# Patient Record
Sex: Female | Born: 1950 | Race: White | Hispanic: No | Marital: Married | Smoking: Former smoker
Health system: Southern US, Community
[De-identification: ages and names within clinical notes are randomized; demographics above are authoritative.]

## PROBLEM LIST (undated history)

## (undated) DIAGNOSIS — F10239 Alcohol dependence with withdrawal, unspecified: Secondary | ICD-10-CM

## (undated) DIAGNOSIS — J449 Chronic obstructive pulmonary disease, unspecified: Secondary | ICD-10-CM

## (undated) DIAGNOSIS — Z9889 Other specified postprocedural states: Secondary | ICD-10-CM

## (undated) DIAGNOSIS — A419 Sepsis, unspecified organism: Secondary | ICD-10-CM

## (undated) DIAGNOSIS — R6521 Severe sepsis with septic shock: Secondary | ICD-10-CM

## (undated) DIAGNOSIS — K219 Gastro-esophageal reflux disease without esophagitis: Secondary | ICD-10-CM

## (undated) DIAGNOSIS — R569 Unspecified convulsions: Secondary | ICD-10-CM

## (undated) DIAGNOSIS — G9341 Metabolic encephalopathy: Secondary | ICD-10-CM

## (undated) DIAGNOSIS — F419 Anxiety disorder, unspecified: Secondary | ICD-10-CM

## (undated) DIAGNOSIS — I340 Nonrheumatic mitral (valve) insufficiency: Secondary | ICD-10-CM

## (undated) DIAGNOSIS — J9621 Acute and chronic respiratory failure with hypoxia: Secondary | ICD-10-CM

## (undated) DIAGNOSIS — J189 Pneumonia, unspecified organism: Secondary | ICD-10-CM

## (undated) DIAGNOSIS — I10 Essential (primary) hypertension: Secondary | ICD-10-CM

## (undated) HISTORY — PX: COLONOSCOPY WITH ESOPHAGOGASTRODUODENOSCOPY (EGD): SHX5779

## (undated) HISTORY — PX: EYE SURGERY: SHX253

## (undated) HISTORY — PX: TUBAL LIGATION: SHX77

## (undated) HISTORY — PX: COLONOSCOPY: SHX174

---

## 2010-04-14 DEATH — deceased

## 2017-08-02 ENCOUNTER — Other Ambulatory Visit (HOSPITAL_COMMUNITY): Payer: Medicare Other

## 2017-08-02 ENCOUNTER — Inpatient Hospital Stay
Admit: 2017-08-02 | Discharge: 2017-09-04 | Disposition: A | Discharge status: Skilled Nursing Facility | Payer: Medicare Other | Attending: Internal Medicine | Admitting: Internal Medicine

## 2017-08-02 DIAGNOSIS — J90 Pleural effusion, not elsewhere classified: Secondary | ICD-10-CM

## 2017-08-02 DIAGNOSIS — R569 Unspecified convulsions: Secondary | ICD-10-CM

## 2017-08-02 DIAGNOSIS — I340 Nonrheumatic mitral (valve) insufficiency: Secondary | ICD-10-CM

## 2017-08-02 DIAGNOSIS — J189 Pneumonia, unspecified organism: Secondary | ICD-10-CM

## 2017-08-02 DIAGNOSIS — Z431 Encounter for attention to gastrostomy: Secondary | ICD-10-CM

## 2017-08-02 DIAGNOSIS — Z0189 Encounter for other specified special examinations: Secondary | ICD-10-CM

## 2017-08-02 DIAGNOSIS — R609 Edema, unspecified: Secondary | ICD-10-CM

## 2017-08-02 DIAGNOSIS — J9690 Respiratory failure, unspecified, unspecified whether with hypoxia or hypercapnia: Secondary | ICD-10-CM

## 2017-08-02 DIAGNOSIS — F10239 Alcohol dependence with withdrawal, unspecified: Secondary | ICD-10-CM

## 2017-08-02 DIAGNOSIS — A419 Sepsis, unspecified organism: Secondary | ICD-10-CM

## 2017-08-02 DIAGNOSIS — T85598A Other mechanical complication of other gastrointestinal prosthetic devices, implants and grafts, initial encounter: Secondary | ICD-10-CM

## 2017-08-02 DIAGNOSIS — G9341 Metabolic encephalopathy: Secondary | ICD-10-CM

## 2017-08-02 DIAGNOSIS — R1310 Dysphagia, unspecified: Secondary | ICD-10-CM

## 2017-08-02 DIAGNOSIS — J9621 Acute and chronic respiratory failure with hypoxia: Secondary | ICD-10-CM

## 2017-08-02 DIAGNOSIS — R0603 Acute respiratory distress: Secondary | ICD-10-CM

## 2017-08-02 DIAGNOSIS — R6521 Severe sepsis with septic shock: Secondary | ICD-10-CM

## 2017-08-02 DIAGNOSIS — J449 Chronic obstructive pulmonary disease, unspecified: Secondary | ICD-10-CM | POA: Diagnosis present

## 2017-08-02 DIAGNOSIS — R1314 Dysphagia, pharyngoesophageal phase: Secondary | ICD-10-CM

## 2017-08-02 HISTORY — DX: Sepsis, unspecified organism: A41.9

## 2017-08-02 HISTORY — DX: Other specified postprocedural states: Z98.890

## 2017-08-02 HISTORY — DX: Unspecified convulsions: R56.9

## 2017-08-02 HISTORY — DX: Essential (primary) hypertension: I10

## 2017-08-02 HISTORY — DX: Severe sepsis with septic shock: R65.21

## 2017-08-02 HISTORY — DX: Metabolic encephalopathy: G93.41

## 2017-08-02 HISTORY — DX: Gastro-esophageal reflux disease without esophagitis: K21.9

## 2017-08-02 HISTORY — DX: Acute and chronic respiratory failure with hypoxia: J96.21

## 2017-08-02 HISTORY — DX: Chronic obstructive pulmonary disease, unspecified: J44.9

## 2017-08-02 HISTORY — DX: Anxiety disorder, unspecified: F41.9

## 2017-08-02 HISTORY — DX: Alcohol dependence with withdrawal, unspecified: F10.239

## 2017-08-02 HISTORY — DX: Nonrheumatic mitral (valve) insufficiency: I34.0

## 2017-08-02 HISTORY — DX: Pneumonia, unspecified organism: J18.9

## 2017-08-02 LAB — CBC WITH DIFFERENTIAL/PLATELET
ABS IMMATURE GRANULOCYTES: 0.2 10*3/uL — AB (ref 0.0–0.1)
BASOS ABS: 0.1 10*3/uL (ref 0.0–0.1)
Basophils Relative: 0 %
Eosinophils Absolute: 0.4 10*3/uL (ref 0.0–0.7)
Eosinophils Relative: 2 %
HEMATOCRIT: 30.1 % — AB (ref 36.0–46.0)
HEMOGLOBIN: 8.8 g/dL — AB (ref 12.0–15.0)
Immature Granulocytes: 1 %
LYMPHS ABS: 3 10*3/uL (ref 0.7–4.0)
LYMPHS PCT: 15 %
MCH: 28.4 pg (ref 26.0–34.0)
MCHC: 29.2 g/dL — ABNORMAL LOW (ref 30.0–36.0)
MCV: 97.1 fL (ref 78.0–100.0)
MONO ABS: 1.6 10*3/uL — AB (ref 0.1–1.0)
Monocytes Relative: 8 %
NEUTROS ABS: 15.4 10*3/uL — AB (ref 1.7–7.7)
Neutrophils Relative %: 74 %
Platelets: 360 10*3/uL (ref 150–400)
RBC: 3.1 MIL/uL — ABNORMAL LOW (ref 3.87–5.11)
RDW: 17.2 % — ABNORMAL HIGH (ref 11.5–15.5)
WBC: 20.6 10*3/uL — ABNORMAL HIGH (ref 4.0–10.5)

## 2017-08-02 LAB — BLOOD GAS, ARTERIAL
ACID-BASE EXCESS: 9.4 mmol/L — AB (ref 0.0–2.0)
BICARBONATE: 33.1 mmol/L — AB (ref 20.0–28.0)
FIO2: 40
MECHVT: 370 mL
O2 SAT: 96.2 %
PATIENT TEMPERATURE: 98.6
PCO2 ART: 42.2 mmHg (ref 32.0–48.0)
PEEP: 5 cmH2O
PO2 ART: 76.1 mmHg — AB (ref 83.0–108.0)
RATE: 12 resp/min
pH, Arterial: 7.507 — ABNORMAL HIGH (ref 7.350–7.450)

## 2017-08-02 LAB — COMPREHENSIVE METABOLIC PANEL
ALBUMIN: 1.9 g/dL — AB (ref 3.5–5.0)
ALT: 23 U/L (ref 14–54)
ANION GAP: 9 (ref 5–15)
AST: 34 U/L (ref 15–41)
Alkaline Phosphatase: 138 U/L — ABNORMAL HIGH (ref 38–126)
BILIRUBIN TOTAL: 0.5 mg/dL (ref 0.3–1.2)
BUN: 15 mg/dL (ref 6–20)
CHLORIDE: 97 mmol/L — AB (ref 101–111)
CO2: 31 mmol/L (ref 22–32)
Calcium: 8.5 mg/dL — ABNORMAL LOW (ref 8.9–10.3)
Creatinine, Ser: 0.38 mg/dL — ABNORMAL LOW (ref 0.44–1.00)
GFR calc Af Amer: >60 mL/min (ref >60)
GFR calc non Af Amer: >60 mL/min (ref >60)
GLUCOSE: 69 mg/dL (ref 65–99)
POTASSIUM: 3.8 mmol/L (ref 3.5–5.1)
SODIUM: 137 mmol/L (ref 135–145)
Total Protein: 6.4 g/dL — ABNORMAL LOW (ref 6.5–8.1)

## 2017-08-02 LAB — PROTIME-INR
INR: 1.15
PROTHROMBIN TIME: 14.7 s (ref 11.4–15.2)

## 2017-08-03 ENCOUNTER — Other Ambulatory Visit (HOSPITAL_COMMUNITY): Payer: Medicare Other

## 2017-08-03 LAB — C DIFFICILE QUICK SCREEN W PCR REFLEX
C DIFFICILE (CDIFF) INTERP: NOT DETECTED
C DIFFICILE (CDIFF) TOXIN: NEGATIVE
C DIFFICLE (CDIFF) ANTIGEN: NEGATIVE

## 2017-08-03 LAB — MAGNESIUM: Magnesium: 1.8 mg/dL (ref 1.7–2.4)

## 2017-08-03 MED ORDER — TROPICAL LIQUID NUTRITION PO LIQD
5.00 | ORAL | Status: DC
Start: ? — End: 2017-08-03

## 2017-08-03 MED ORDER — THIAMINE HCL 100 MG PO TABS
100.00 | ORAL_TABLET | ORAL | Status: DC
Start: ? — End: 2017-08-03

## 2017-08-03 MED ORDER — GENERIC EXTERNAL MEDICATION
200.00 | Status: DC
Start: 2017-08-02 — End: 2017-08-03

## 2017-08-03 MED ORDER — OXYCODONE HCL 5 MG/5ML PO SOLN
5.00 | ORAL | Status: DC
Start: ? — End: 2017-08-03

## 2017-08-03 MED ORDER — SODIUM CHLORIDE 0.9 % IV SOLN
10.00 | INTRAVENOUS | Status: DC
Start: ? — End: 2017-08-03

## 2017-08-03 MED ORDER — ALTEPLASE 2 MG IJ SOLR
2.00 | INTRAMUSCULAR | Status: DC
Start: ? — End: 2017-08-03

## 2017-08-03 MED ORDER — CLOTRIMAZOLE-BETAMETHASONE 1-0.05 % EX CREA
TOPICAL_CREAM | CUTANEOUS | Status: DC
Start: 2017-08-02 — End: 2017-08-03

## 2017-08-03 MED ORDER — POTASSIUM CHLORIDE CRYS ER 20 MEQ PO TBCR
20.00 | EXTENDED_RELEASE_TABLET | ORAL | Status: DC
Start: ? — End: 2017-08-03

## 2017-08-03 MED ORDER — GENERIC EXTERNAL MEDICATION
Status: DC
Start: ? — End: 2017-08-03

## 2017-08-03 MED ORDER — MAGNESIUM OXIDE 400 MG PO TABS
800.00 | ORAL_TABLET | ORAL | Status: DC
Start: 2017-08-02 — End: 2017-08-03

## 2017-08-03 MED ORDER — FOLIC ACID 1 MG PO TABS
1.00 | ORAL_TABLET | ORAL | Status: DC
Start: 2017-08-03 — End: 2017-08-03

## 2017-08-03 MED ORDER — POTASSIUM CHLORIDE 20 MEQ/50ML IV SOLN
20.00 | INTRAVENOUS | Status: DC
Start: ? — End: 2017-08-03

## 2017-08-03 MED ORDER — POTASSIUM CHLORIDE 20 MEQ/15ML (10%) PO SOLN
20.00 | ORAL | Status: DC
Start: ? — End: 2017-08-03

## 2017-08-03 MED ORDER — BUMETANIDE 0.25 MG/ML IJ SOLN
1.00 | INTRAMUSCULAR | Status: DC
Start: 2017-08-02 — End: 2017-08-03

## 2017-08-03 MED ORDER — INSULIN LISPRO 100 UNIT/ML ~~LOC~~ SOLN
1.00 | SUBCUTANEOUS | Status: DC
Start: ? — End: 2017-08-03

## 2017-08-03 MED ORDER — FAMOTIDINE 20 MG/2ML IV SOLN
20.00 | INTRAVENOUS | Status: DC
Start: 2017-08-02 — End: 2017-08-03

## 2017-08-03 MED ORDER — INSULIN GLARGINE 100 UNIT/ML ~~LOC~~ SOLN
1.00 | SUBCUTANEOUS | Status: DC
Start: 2017-08-02 — End: 2017-08-03

## 2017-08-04 LAB — BLOOD CULTURE ID PANEL (REFLEXED)
Acinetobacter baumannii: NOT DETECTED
CANDIDA ALBICANS: NOT DETECTED
CANDIDA GLABRATA: NOT DETECTED
Candida krusei: NOT DETECTED
Candida parapsilosis: NOT DETECTED
Candida tropicalis: NOT DETECTED
ENTEROBACTERIACEAE SPECIES: NOT DETECTED
ENTEROCOCCUS SPECIES: NOT DETECTED
ESCHERICHIA COLI: NOT DETECTED
Enterobacter cloacae complex: NOT DETECTED
Haemophilus influenzae: NOT DETECTED
Klebsiella oxytoca: NOT DETECTED
Klebsiella pneumoniae: NOT DETECTED
LISTERIA MONOCYTOGENES: NOT DETECTED
Methicillin resistance: DETECTED — AB
NEISSERIA MENINGITIDIS: NOT DETECTED
Proteus species: NOT DETECTED
Pseudomonas aeruginosa: NOT DETECTED
STREPTOCOCCUS AGALACTIAE: NOT DETECTED
STREPTOCOCCUS PNEUMONIAE: NOT DETECTED
STREPTOCOCCUS PYOGENES: NOT DETECTED
STREPTOCOCCUS SPECIES: NOT DETECTED
Serratia marcescens: NOT DETECTED
Staphylococcus aureus (BCID): NOT DETECTED
Staphylococcus species: DETECTED — AB

## 2017-08-04 LAB — URINALYSIS, ROUTINE W REFLEX MICROSCOPIC
BILIRUBIN URINE: NEGATIVE
Bacteria, UA: NONE SEEN
GLUCOSE, UA: NEGATIVE mg/dL
KETONES UR: NEGATIVE mg/dL
Leukocytes, UA: NEGATIVE
Nitrite: NEGATIVE
Protein, ur: NEGATIVE mg/dL
Specific Gravity, Urine: 1.019 (ref 1.005–1.030)
pH: 6 (ref 5.0–8.0)

## 2017-08-04 LAB — BASIC METABOLIC PANEL
ANION GAP: 8 (ref 5–15)
BUN: 15 mg/dL (ref 6–20)
CO2: 32 mmol/L (ref 22–32)
CREATININE: 0.42 mg/dL — AB (ref 0.44–1.00)
Calcium: 8 mg/dL — ABNORMAL LOW (ref 8.9–10.3)
Chloride: 95 mmol/L — ABNORMAL LOW (ref 101–111)
GFR calc Af Amer: >60 mL/min (ref >60)
GFR calc non Af Amer: >60 mL/min (ref >60)
GLUCOSE: 105 mg/dL — AB (ref 65–99)
Potassium: 3.1 mmol/L — ABNORMAL LOW (ref 3.5–5.1)
Sodium: 135 mmol/L (ref 135–145)

## 2017-08-04 LAB — MAGNESIUM: Magnesium: 1.8 mg/dL (ref 1.7–2.4)

## 2017-08-04 NOTE — Progress Notes (Signed)
PHARMACY - PHYSICIAN COMMUNICATION CRITICAL VALUE ALERT - BLOOD CULTURE IDENTIFICATION (BCID)  Tiffany Gordon is an 67 y.o. female who [facility]   Assessment:  1/4 blood cultures with MRSE. Likely contaminant  Name of physician (or Provider) Contacted: Hijazi   Current antibiotics: None currently  Changes to prescribed antibiotics recommended:  Would not recommend adding any antibiotics based on this blood culture   Results for orders placed or performed during the hospital encounter of 08/02/17  Blood Culture ID Panel (Reflexed) (Collected: 08/03/2017 12:28 PM)  Result Value Ref Range   Enterococcus species NOT DETECTED NOT DETECTED   Listeria monocytogenes NOT DETECTED NOT DETECTED   Staphylococcus species DETECTED (A) NOT DETECTED   Staphylococcus aureus NOT DETECTED NOT DETECTED   Methicillin resistance DETECTED (A) NOT DETECTED   Streptococcus species NOT DETECTED NOT DETECTED   Streptococcus agalactiae NOT DETECTED NOT DETECTED   Streptococcus pneumoniae NOT DETECTED NOT DETECTED   Streptococcus pyogenes NOT DETECTED NOT DETECTED   Acinetobacter baumannii NOT DETECTED NOT DETECTED   Enterobacteriaceae species NOT DETECTED NOT DETECTED   Enterobacter cloacae complex NOT DETECTED NOT DETECTED   Escherichia coli NOT DETECTED NOT DETECTED   Klebsiella oxytoca NOT DETECTED NOT DETECTED   Klebsiella pneumoniae NOT DETECTED NOT DETECTED   Proteus species NOT DETECTED NOT DETECTED   Serratia marcescens NOT DETECTED NOT DETECTED   Haemophilus influenzae NOT DETECTED NOT DETECTED   Neisseria meningitidis NOT DETECTED NOT DETECTED   Pseudomonas aeruginosa NOT DETECTED NOT DETECTED   Candida albicans NOT DETECTED NOT DETECTED   Candida glabrata NOT DETECTED NOT DETECTED   Candida krusei NOT DETECTED NOT DETECTED   Candida parapsilosis NOT DETECTED NOT DETECTED   Candida tropicalis NOT DETECTED NOT DETECTED   Sharin MonsEmily Asna Muldrow, PharmD,  BCPS PGY2 Infectious Diseases Pharmacy Resident Phone: [phone] 08/04/2017  3:52 PM

## 2017-08-05 ENCOUNTER — Institutional Professional Consult (permissible substitution) (HOSPITAL_COMMUNITY): Payer: Medicare Other

## 2017-08-05 LAB — BASIC METABOLIC PANEL
ANION GAP: 10 (ref 5–15)
BUN: 12 mg/dL (ref 6–20)
CO2: 35 mmol/L — ABNORMAL HIGH (ref 22–32)
Calcium: 8.1 mg/dL — ABNORMAL LOW (ref 8.9–10.3)
Chloride: 91 mmol/L — ABNORMAL LOW (ref 101–111)
Creatinine, Ser: 0.43 mg/dL — ABNORMAL LOW (ref 0.44–1.00)
GFR calc Af Amer: >60 mL/min (ref >60)
GLUCOSE: 121 mg/dL — AB (ref 65–99)
Potassium: 3 mmol/L — ABNORMAL LOW (ref 3.5–5.1)
SODIUM: 136 mmol/L (ref 135–145)

## 2017-08-05 LAB — CBC
HCT: 26 % — ABNORMAL LOW (ref 36.0–46.0)
Hemoglobin: 7.8 g/dL — ABNORMAL LOW (ref 12.0–15.0)
MCH: 28.4 pg (ref 26.0–34.0)
MCHC: 30 g/dL (ref 30.0–36.0)
MCV: 94.5 fL (ref 78.0–100.0)
PLATELETS: 375 10*3/uL (ref 150–400)
RBC: 2.75 MIL/uL — ABNORMAL LOW (ref 3.87–5.11)
RDW: 16.7 % — AB (ref 11.5–15.5)
WBC: 14.7 10*3/uL — ABNORMAL HIGH (ref 4.0–10.5)

## 2017-08-05 LAB — BRAIN NATRIURETIC PEPTIDE: B NATRIURETIC PEPTIDE 5: 940.5 pg/mL — AB (ref 0.0–100.0)

## 2017-08-05 MED ORDER — GENERIC EXTERNAL MEDICATION
Status: DC
Start: ? — End: 2017-08-05

## 2017-08-06 DIAGNOSIS — J449 Chronic obstructive pulmonary disease, unspecified: Secondary | ICD-10-CM | POA: Diagnosis not present

## 2017-08-06 DIAGNOSIS — R569 Unspecified convulsions: Secondary | ICD-10-CM

## 2017-08-06 DIAGNOSIS — R6521 Severe sepsis with septic shock: Secondary | ICD-10-CM

## 2017-08-06 DIAGNOSIS — J90 Pleural effusion, not elsewhere classified: Secondary | ICD-10-CM

## 2017-08-06 DIAGNOSIS — G9341 Metabolic encephalopathy: Secondary | ICD-10-CM | POA: Diagnosis not present

## 2017-08-06 DIAGNOSIS — I340 Nonrheumatic mitral (valve) insufficiency: Secondary | ICD-10-CM | POA: Diagnosis not present

## 2017-08-06 DIAGNOSIS — A419 Sepsis, unspecified organism: Secondary | ICD-10-CM

## 2017-08-06 DIAGNOSIS — F10239 Alcohol dependence with withdrawal, unspecified: Secondary | ICD-10-CM

## 2017-08-06 DIAGNOSIS — J189 Pneumonia, unspecified organism: Secondary | ICD-10-CM

## 2017-08-06 DIAGNOSIS — J9621 Acute and chronic respiratory failure with hypoxia: Secondary | ICD-10-CM | POA: Diagnosis not present

## 2017-08-06 LAB — URINE CULTURE: CULTURE: NO GROWTH

## 2017-08-06 LAB — VANCOMYCIN, TROUGH: Vancomycin Tr: 6 ug/mL — ABNORMAL LOW (ref 15–20)

## 2017-08-06 LAB — POTASSIUM: POTASSIUM: 4.1 mmol/L (ref 3.5–5.1)

## 2017-08-07 ENCOUNTER — Encounter: Payer: Self-pay | Admitting: Internal Medicine

## 2017-08-07 DIAGNOSIS — F10239 Alcohol dependence with withdrawal, unspecified: Secondary | ICD-10-CM | POA: Diagnosis not present

## 2017-08-07 DIAGNOSIS — J9621 Acute and chronic respiratory failure with hypoxia: Secondary | ICD-10-CM

## 2017-08-07 DIAGNOSIS — R6521 Severe sepsis with septic shock: Secondary | ICD-10-CM

## 2017-08-07 DIAGNOSIS — J189 Pneumonia, unspecified organism: Secondary | ICD-10-CM

## 2017-08-07 DIAGNOSIS — I340 Nonrheumatic mitral (valve) insufficiency: Secondary | ICD-10-CM

## 2017-08-07 DIAGNOSIS — R569 Unspecified convulsions: Secondary | ICD-10-CM

## 2017-08-07 DIAGNOSIS — A419 Sepsis, unspecified organism: Secondary | ICD-10-CM

## 2017-08-07 DIAGNOSIS — G9341 Metabolic encephalopathy: Secondary | ICD-10-CM | POA: Diagnosis not present

## 2017-08-07 DIAGNOSIS — F10939 Alcohol use, unspecified with withdrawal, unspecified: Secondary | ICD-10-CM

## 2017-08-07 DIAGNOSIS — J449 Chronic obstructive pulmonary disease, unspecified: Secondary | ICD-10-CM | POA: Diagnosis present

## 2017-08-07 HISTORY — DX: Alcohol dependence with withdrawal, unspecified: F10.239

## 2017-08-07 HISTORY — DX: Acute and chronic respiratory failure with hypoxia: J96.21

## 2017-08-07 HISTORY — DX: Sepsis, unspecified organism: A41.9

## 2017-08-07 HISTORY — DX: Unspecified convulsions: R56.9

## 2017-08-07 HISTORY — DX: Pneumonia, unspecified organism: J18.9

## 2017-08-07 HISTORY — DX: Metabolic encephalopathy: G93.41

## 2017-08-07 HISTORY — DX: Nonrheumatic mitral (valve) insufficiency: I34.0

## 2017-08-07 HISTORY — DX: Alcohol use, unspecified with withdrawal, unspecified: F10.939

## 2017-08-07 LAB — CULTURE, BLOOD (ROUTINE X 2)

## 2017-08-07 NOTE — Progress Notes (Signed)
[facility]   PULMONARY SERVICE  PROGRESS NOTE  Date of Service: 08/07/2017  Tiffany Gordon Brott  ZOX:[id number]  DOB: 08/15/1950   DOA: 08/02/2017  Referring Physician: Carron CurieAli Hijazi, MD  HPI: Tiffany Gordon Low is a 67 y.o. female seen for follow up of Acute on Chronic Respiratory Failure.  She weaned for about 4 hours on pressure support mode tolerated it fairly well right now is back on resting mode and assist control  Medications: Reviewed on Rounds  Physical Exam:  Vitals: Temperature 98.4 pulse 88 respiratory rate 26 blood pressure is 118/68 saturations 100%  Ventilator Settings mode of ventilation assist control FiO2 40% tidal volume is 421 PEEP 5  . General: Comfortable at this time . Eyes: Grossly normal lids, irises & conjunctiva . ENT: grossly tongue is normal . Neck: no obvious mass . Cardiovascular: S1 S2 normal no gallop . Respiratory: No rhonchi or rales are noted . Abdomen: soft . Skin: no rash seen on limited exam . Musculoskeletal: not rigid . Psychiatric:unable to assess . Neurologic: no seizure no involuntary movements         Lab Data:   Basic Metabolic Panel: Recent Labs  Lab 08/02/17 1900 08/03/17 0901 08/04/17 0753 08/05/17 1004 08/06/17 1409  NA 137  --  135 136  --   K 3.8  --  3.1* 3.0* 4.1  CL 97*  --  95* 91*  --   CO2 31  --  32 35*  --   GLUCOSE 69  --  105* 121*  --   BUN 15  --  15 12  --   CREATININE 0.38*  --  0.42* 0.43*  --   CALCIUM 8.5*  --  8.0* 8.1*  --   MG  --  1.8 1.8  --   --     Liver Function Tests: Recent Labs  Lab 08/02/17 1900  AST 34  ALT 23  ALKPHOS 138*  BILITOT 0.5  PROT 6.4*  ALBUMIN 1.9*   No results for input(s): LIPASE, AMYLASE in the last 168 hours. No results for input(s): AMMONIA in the last 168 hours.  CBC: Recent Labs  Lab 08/02/17 1900 08/05/17 1004  WBC 20.6* 14.7*  NEUTROABS 15.4*  --   HGB 8.8* 7.8*  HCT 30.1* 26.0*  MCV  97.1 94.5  PLT 360 375    Cardiac Enzymes: No results for input(s): CKTOTAL, CKMB, CKMBINDEX, TROPONINI in the last 168 hours.  BNP (last 3 results) Recent Labs    08/05/17 1004  BNP 940.5*    ProBNP (last 3 results) No results for input(s): PROBNP in the last 8760 hours.  Radiological Exams: No results found.  Assessment/Plan Active Problems:   Acute on chronic respiratory failure with hypoxia (HCC)   Severe sepsis with septic shock (HCC)   Healthcare-associated pneumonia   Acute metabolic encephalopathy   Alcohol withdrawal seizure with complication, with unspecified complication (HCC)   Severe mitral valve regurgitation   COPD (chronic obstructive pulmonary disease) (HCC)   1. Acute on chronic respiratory failure with hypoxia we will continue with full support on assist control right now is on 40% FiO2 continue pulmonary toilet secretion management patient's prognosis is guarded as far as weaning is concerned. 2. Severe sepsis with shock treated with antibiotics we will continue to monitor. 3. Healthcare associated pneumonia treated with antibiotics currently is clinically improving 4. Alcohol withdrawal no active seizures noted 5. Severe mitral regurg at baseline 6. COPD severe  disease we will continue to monitor   I have personally seen and evaluated the patient, evaluated laboratory and imaging results, formulated the assessment and plan and placed orders. The Patient requires high complexity decision making for assessment and support.  Case was discussed on Rounds with the Respiratory Therapy Staff  Yevonne Pax, MD [facility] Pulmonary Critical Care Medicine Sleep Medicine

## 2017-08-07 NOTE — Consult Note (Signed)
[facility] [place]  [facility]UL[facility]  Date of Service: 08/06/2017 PULMONARY CONSULT   Tiffany NailerDeborah Parks Belflower  ZOX:[id number]  DOB: 01/14/1951   DOA: 08/02/2017  Referring Physician: Carron CurieAli Hijazi, MD  HPI: Tiffany NailerDeborah Parks Gordon is a 67 y.o. female seen for follow up of Acute on Chronic Respiratory Failure.  Patient presented to the hospital with multiple medical problems including a history of COPD, will abuse and tobacco abuse came to the hospital with a withdrawal seizure.  At the time of presentation chest x-ray was done which showed pneumonia along with an effusion.  Respiratory status apparently continued to worsen ended up using BiPAP initially had been placed on 100% FiO2.  However at this was not enough to hold the patient's status and so therefore the patient was intubated.  Patient was diuresed because also was felt to have CHF.  Patient also was hypotensive at the time and had an elevated lactate.  Patient underwent an MRI to evaluate the seizure but there was no active neurological disease noted.  It was felt mostly be due to the alcohol history.  In addition patient developed signs and symptoms of work Warnicke's encephalopathy and so therefore was given thiamine.  The patient also was started on antibiotics for presumed sepsis patient was treated with Zosyn.  She eventually ended up having to have a tracheostomy done because of failure to come off of the ventilator.  At this time the patient is on the ventilator and has been on pressure assist control mode currently requiring about 40% oxygen.  Review of Systems:  ROS performed and is unremarkable other than noted above.  Past Medical History:  Diagnosis Date  . Acute metabolic encephalopathy 08/07/2017  . Acute on chronic respiratory failure with hypoxia (HCC) 08/07/2017  . Alcohol withdrawal seizure with complication, with unspecified complication (HCC) 08/07/2017  . Anxiety disorder   . COPD  (chronic obstructive pulmonary disease) (HCC)   . GERD without esophagitis   . Healthcare-associated pneumonia 08/07/2017  . History of esophagogastroduodenoscopy (EGD)   . Hypertension   . Severe mitral valve regurgitation 08/07/2017  . Severe sepsis with septic shock (HCC) 08/07/2017    Past Surgical History:  Procedure Laterality Date  . COLONOSCOPY    . COLONOSCOPY WITH ESOPHAGOGASTRODUODENOSCOPY (EGD)    . EYE SURGERY    . TUBAL LIGATION      Social History:    reports that she has quit smoking. She has never used smokeless tobacco. She reports that she drank alcohol. She reports that she has current or past drug history.  Family History: Non-Contributory to the present illness  Allergies not on file  Medications: Reviewed on Rounds  Physical Exam:  Vitals: Temperature 97.2 pulse 89 respiratory rate 37 blood pressure 107/52 saturations 98%  Ventilator Settings mode of ventilation pressure assist control FiO2 40% PEEP 5 tidal volume 450  . General: Comfortable at this time . Eyes: Grossly normal lids, irises & conjunctiva . ENT: grossly tongue is normal . Neck: no obvious mass . Cardiovascular: S1-S2 normal no gallop or rub . Respiratory: No rhonchi were noted at this time . Abdomen: Soft and nontender . Skin: no rash seen on limited exam . Musculoskeletal: not rigid . Psychiatric:unable to assess . Neurologic: no seizure no involuntary movements         Labs on Admission:  Basic Metabolic Panel: Recent Labs  Lab 08/02/17 1900 08/03/17 0901 08/04/17 0753 08/05/17 1004 08/06/17 1409  NA 137  --  135 136  --   K 3.8  --  3.1* 3.0* 4.1  CL 97*  --  95* 91*  --   CO2 31  --  32 35*  --   GLUCOSE 69  --  105* 121*  --   BUN 15  --  15 12  --   CREATININE 0.38*  --  0.42* 0.43*  --   CALCIUM 8.5*  --  8.0* 8.1*  --   MG  --  1.8 1.8  --   --     Liver Function Tests: Recent Labs  Lab 08/02/17 1900  AST 34  ALT 23  ALKPHOS 138*  BILITOT 0.5  PROT  6.4*  ALBUMIN 1.9*   No results for input(s): LIPASE, AMYLASE in the last 168 hours. No results for input(s): AMMONIA in the last 168 hours.  CBC: Recent Labs  Lab 08/02/17 1900 08/05/17 1004  WBC 20.6* 14.7*  NEUTROABS 15.4*  --   HGB 8.8* 7.8*  HCT 30.1* 26.0*  MCV 97.1 94.5  PLT 360 375    Cardiac Enzymes: No results for input(s): CKTOTAL, CKMB, CKMBINDEX, TROPONINI in the last 168 hours.  BNP (last 3 results) Recent Labs    08/05/17 1004  BNP 940.5*    ProBNP (last 3 results) No results for input(s): PROBNP in the last 8760 hours.   Radiological Exams on Admission: Dg Chest Port 1 View  Result Date: 08/05/2017 CLINICAL DATA:  Followup for pleural effusions and edema. EXAM: PORTABLE CHEST 1 VIEW COMPARISON:  08/03/2017 FINDINGS: Bilateral pleural effusions obscure the hemidiaphragms. There is hazy airspace opacity with interstitial prominence in the lungs, greater on the right. The appearance is unchanged from the prior study allowing for differences in patient positioning and technique. No pneumothorax. Tracheostomy tube, nasogastric tube and left internal jugular central venous line are stable. IMPRESSION: 1. No significant change when compared the prior study. 2. Bilateral pleural effusions. Interstitial and hazy airspace lung opacities, most evident on the right. Findings are consistent with asymmetric edema. Electronically Signed   By: Amie Portland M.D.   On: 08/05/2017 15:52    Assessment/Plan Active Problems:   Acute on chronic respiratory failure with hypoxia (HCC)   Severe sepsis with septic shock (HCC)   Healthcare-associated pneumonia   Acute metabolic encephalopathy   Alcohol withdrawal seizure with complication, with unspecified complication (HCC)   Severe mitral valve regurgitation   COPD (chronic obstructive pulmonary disease) (HCC)   1. Acute on chronic respiratory failure with hypoxia at this time patient will be tried on pressure support  ventilation as discussed above.  We will continue with supportive care titrate oxygen titrate pulmonary volumes as tolerated. 2. Severe sepsis with shock currently patient is hemodynamically stable we will continue with present management. 3. Healthcare associated pneumonia was treated at the outside facility we will continue to monitor with chest x-rays.  Last film showed no significant changed with bilateral pleural effusion and some hazy airspace diseases. 4. Metabolic encephalopathy she seems to be at baseline. 5. COPD severe disease we will continue to follow 6. Severe mitral regurgitation we will continue to follow need to follow the fluid status very closely as she could easily become fluid overloaded from decompensation.  I have personally seen and evaluated the patient, evaluated laboratory and imaging results, formulated the assessment and plan and placed orders. The Patient requires high complexity decision making for assessment and support.  Case was discussed on Rounds with the Respiratory Therapy Staff Time Spent  70minutes  Yevonne PaxSaadat A Jerah Esty, MD [facility] Pulmonary Critical Care Medicine Sleep Medicine

## 2017-08-08 DIAGNOSIS — J449 Chronic obstructive pulmonary disease, unspecified: Secondary | ICD-10-CM | POA: Diagnosis not present

## 2017-08-08 DIAGNOSIS — G9341 Metabolic encephalopathy: Secondary | ICD-10-CM | POA: Diagnosis not present

## 2017-08-08 DIAGNOSIS — J9621 Acute and chronic respiratory failure with hypoxia: Secondary | ICD-10-CM | POA: Diagnosis not present

## 2017-08-08 DIAGNOSIS — F10239 Alcohol dependence with withdrawal, unspecified: Secondary | ICD-10-CM | POA: Diagnosis not present

## 2017-08-08 LAB — VANCOMYCIN, TROUGH
Vancomycin Tr: 23 ug/mL (ref 15–20)
Vancomycin Tr: 19 ug/mL (ref 15–20)

## 2017-08-08 LAB — CBC
HCT: 26.6 % — ABNORMAL LOW (ref 36.0–46.0)
HEMOGLOBIN: 7.7 g/dL — AB (ref 12.0–15.0)
MCH: 28.4 pg (ref 26.0–34.0)
MCHC: 28.9 g/dL — ABNORMAL LOW (ref 30.0–36.0)
MCV: 98.2 fL (ref 78.0–100.0)
PLATELETS: 434 10*3/uL — AB (ref 150–400)
RBC: 2.71 MIL/uL — AB (ref 3.87–5.11)
RDW: 17.2 % — ABNORMAL HIGH (ref 11.5–15.5)
WBC: 18.6 10*3/uL — AB (ref 4.0–10.5)

## 2017-08-08 LAB — CULTURE, RESPIRATORY W GRAM STAIN

## 2017-08-08 LAB — CULTURE, RESPIRATORY

## 2017-08-08 NOTE — Progress Notes (Signed)
Pulmonary Critical Care Medicine [facility] [place]   PULMONARY SERVICE  PROGRESS NOTE  Date of Service: 08/08/2017  Tiffany Gordon  ZOX:[id number]  DOB: 09-16-1950   DOA: 08/02/2017  Referring Physician: Carron Curie, MD  HPI: Tiffany Gordon is a 67 y.o. female seen for follow up of Acute on Chronic Respiratory Failure.  Patient was on pressure support mode has been tolerating the wean fairly well.  The goal for pressure support is now 8 hours.  Currently is on 28% FiO2  Medications: Reviewed on Rounds  Physical Exam:  Vitals: Temperature 98.2 pulse 82 respiratory rate 15 blood pressure 134/75 saturations 100%  Ventilator Settings mode of ventilation pressure support FiO2 28% tidal volume 502 pressure support 12 PEEP 5  . General: Comfortable at this time . Eyes: Grossly normal lids, irises & conjunctiva . ENT: grossly tongue is normal . Neck: no obvious mass . Cardiovascular: S1 S2 normal no gallop . Respiratory: No rhonchi expansion is equal . Abdomen: soft . Skin: no rash seen on limited exam . Musculoskeletal: not rigid . Psychiatric:unable to assess . Neurologic: no seizure no involuntary movements         Lab Data:   Basic Metabolic Panel: Recent Labs  Lab 08/02/17 1900 08/03/17 0901 08/04/17 0753 08/05/17 1004 08/06/17 1409  NA 137  --  135 136  --   K 3.8  --  3.1* 3.0* 4.1  CL 97*  --  95* 91*  --   CO2 31  --  32 35*  --   GLUCOSE 69  --  105* 121*  --   BUN 15  --  15 12  --   CREATININE 0.38*  --  0.42* 0.43*  --   CALCIUM 8.5*  --  8.0* 8.1*  --   MG  --  1.8 1.8  --   --     Liver Function Tests: Recent Labs  Lab 08/02/17 1900  AST 34  ALT 23  ALKPHOS 138*  BILITOT 0.5  PROT 6.4*  ALBUMIN 1.9*   No results for input(s): LIPASE, AMYLASE in the last 168 hours. No results for input(s): AMMONIA in the last 168 hours.  CBC: Recent Labs  Lab 08/02/17 1900 08/05/17 1004 08/08/17 0656  WBC 20.6* 14.7* 18.6*   NEUTROABS 15.4*  --   --   HGB 8.8* 7.8* 7.7*  HCT 30.1* 26.0* 26.6*  MCV 97.1 94.5 98.2  PLT 360 375 434*    Cardiac Enzymes: No results for input(s): CKTOTAL, CKMB, CKMBINDEX, TROPONINI in the last 168 hours.  BNP (last 3 results) Recent Labs    08/05/17 1004  BNP 940.5*    ProBNP (last 3 results) No results for input(s): PROBNP in the last 8760 hours.  Radiological Exams: No results found.  Assessment/Plan Active Problems:   Acute on chronic respiratory failure with hypoxia (HCC)   Severe sepsis with septic shock (HCC)   Healthcare-associated pneumonia   Acute metabolic encephalopathy   Alcohol withdrawal seizure with complication, with unspecified complication (HCC)   Severe mitral valve regurgitation   COPD (chronic obstructive pulmonary disease) (HCC)   1. Acute on chronic respiratory failure with hypoxia patient can to continue weaning as mentioned the goal is for 8 hours on pressure support today 2. Severe sepsis with hypotension clinically improved patient is hemodynamically stable. 3. Healthcare associated pneumonia treated with antibiotics we will continue to follow 4. Acute metabolic encephalopathy grossly unchanged we will monitor 5. Alcoholic withdrawal seizures no active  seizures noted at this point 6. Severe mitral regurg at baseline 7. COPD severe disease we will monitor   I have personally seen and evaluated the patient, evaluated laboratory and imaging results, formulated the assessment and plan and placed orders. The Patient requires high complexity decision making for assessment and support.  Case was discussed on Rounds with the Respiratory Therapy Staff  Yevonne PaxSaadat A Bentzion Dauria, MD [facility] Pulmonary Critical Care Medicine Sleep Medicine

## 2017-08-10 ENCOUNTER — Other Ambulatory Visit (HOSPITAL_COMMUNITY): Payer: Medicare Other

## 2017-08-10 DIAGNOSIS — J9621 Acute and chronic respiratory failure with hypoxia: Secondary | ICD-10-CM | POA: Diagnosis not present

## 2017-08-10 DIAGNOSIS — G9341 Metabolic encephalopathy: Secondary | ICD-10-CM | POA: Diagnosis not present

## 2017-08-10 DIAGNOSIS — F10239 Alcohol dependence with withdrawal, unspecified: Secondary | ICD-10-CM | POA: Diagnosis not present

## 2017-08-10 DIAGNOSIS — J449 Chronic obstructive pulmonary disease, unspecified: Secondary | ICD-10-CM | POA: Diagnosis not present

## 2017-08-10 MED ORDER — GENERIC EXTERNAL MEDICATION
Status: DC
Start: ? — End: 2017-08-10

## 2017-08-10 NOTE — Progress Notes (Signed)
Pulmonary Critical Care Medicine [facility] [place]   PULMONARY SERVICE  PROGRESS NOTE  Date of Service: 08/10/2017  Tiffany Gordon  XBJ:[id number]  DOB: [birth year]   DOA: 08/02/2017  Referring Physician: Carron Curie, MD  HPI: Tiffany Gordon is a 67 y.o. female seen for follow up of Acute on Chronic Respiratory Failure.  Patient is weaning on pressure support mode at this time.  Has been on 35% FiO2 good volumes are noted  Medications: Reviewed on Rounds  Physical Exam:  Vitals: Temperature 98.9 pulse 89 respiratory rate 38 blood pressure 127/60 saturations 98%  Ventilator Settings mode of ventilation pressure support FiO2 35% tidal volume 362 pressure support 12 PEEP 5  . General: Comfortable at this time . Eyes: Grossly normal lids, irises & conjunctiva . ENT: grossly tongue is normal . Neck: no obvious mass . Cardiovascular: S1 S2 normal no gallop . Respiratory: No rhonchi are noted . Abdomen: soft . Skin: no rash seen on limited exam . Musculoskeletal: not rigid . Psychiatric:unable to assess . Neurologic: no seizure no involuntary movements         Lab Data:   Basic Metabolic Panel: Recent Labs  Lab 08/04/17 0753 08/05/17 1004 08/06/17 1409  NA 135 136  --   K 3.1* 3.0* 4.1  CL 95* 91*  --   CO2 32 35*  --   GLUCOSE 105* 121*  --   BUN 15 12  --   CREATININE 0.42* 0.43*  --   CALCIUM 8.0* 8.1*  --   MG 1.8  --   --     Liver Function Tests: No results for input(s): AST, ALT, ALKPHOS, BILITOT, PROT, ALBUMIN in the last 168 hours. No results for input(s): LIPASE, AMYLASE in the last 168 hours. No results for input(s): AMMONIA in the last 168 hours.  CBC: Recent Labs  Lab 08/05/17 1004 08/08/17 0656  WBC 14.7* 18.6*  HGB 7.8* 7.7*  HCT 26.0* 26.6*  MCV 94.5 98.2  PLT 375 434*    Cardiac Enzymes: No results for input(s): CKTOTAL, CKMB, CKMBINDEX, TROPONINI in the last 168 hours.  BNP (last 3 results) Recent Labs     08/05/17 1004  BNP 940.5*    ProBNP (last 3 results) No results for input(s): PROBNP in the last 8760 hours.  Radiological Exams: Dg Chest Port 1 View  Result Date: 08/10/2017 CLINICAL DATA:  67 year old female with a history EXAM: PORTABLE CHEST 1 VIEW COMPARISON:  08/05/2017, 08/03/2017 FINDINGS: Cardiomediastinal silhouette unchanged in size and contour, partially obscured by overlying lung and pleural disease. Unchanged tracheostomy tube. Unchanged gastric tube terminating out of the field of view. Interval removal of left IJ central venous catheter. There is a cylindrical radiopaque object projecting over the right heart border, not present on the comparison studies. Uncertain on this single frontal view of the AP location. Similar appearance of mixed interstitial and airspace opacities of the bilateral lungs, more prominent in lower lungs. Blunting of the bilateral costophrenic angles. Bilateral rib fractures again noted. IMPRESSION: Similar appearance of mixed interstitial and airspace opacities with bilateral pleural effusions. Interval removal of left IJ catheter. Unchanged tracheostomy tube and gastric tube. Cylindrical radiopaque object measuring approximately 2-3 cm projects over the right heart border. This is favored to be overlying the patient, however, is not located on this single view and a retained foreign body is not excluded. Recommend correlation with any prior intervention, as well as overlying material on the patient's chest. Redemonstration of bilateral rib fractures.  Electronically Signed   By: Gilmer MorJaime  Wagner D.O.   On: 08/10/2017 09:10    Assessment/Plan Active Problems:   Acute on chronic respiratory failure with hypoxia (HCC)   Severe sepsis with septic shock (HCC)   Healthcare-associated pneumonia   Acute metabolic encephalopathy   Alcohol withdrawal seizure with complication, with unspecified complication (HCC)   Severe mitral valve regurgitation   COPD (chronic  obstructive pulmonary disease) (HCC)   1. Acute on chronic respiratory failure with hypoxia we will continue to advance weaning on pressure support mode continue pulmonary toilet secretion management.  Overall prognosis is fairly guarded. 2. Severe sepsis with shock hemodynamically stable we will continue present management 3. Healthcare associated pneumonia treated we will continue with supportive care last x-ray results were reviewed 4. Metabolic encephalopathy grossly unchanged we will continue to monitor. 5. Alcohol withdrawal seizures no active seizures are noted. 6. Mitral regurgitation at baseline 7. COPD severe disease   I have personally seen and evaluated the patient, evaluated laboratory and imaging results, formulated the assessment and plan and placed orders. The Patient requires high complexity decision making for assessment and support.  Case was discussed on Rounds with the Respiratory Therapy Staff  Yevonne PaxSaadat A Khan, MD [facility] Pulmonary Critical Care Medicine Sleep Medicine

## 2017-08-11 DIAGNOSIS — G9341 Metabolic encephalopathy: Secondary | ICD-10-CM | POA: Diagnosis not present

## 2017-08-11 DIAGNOSIS — J9621 Acute and chronic respiratory failure with hypoxia: Secondary | ICD-10-CM | POA: Diagnosis not present

## 2017-08-11 DIAGNOSIS — F10239 Alcohol dependence with withdrawal, unspecified: Secondary | ICD-10-CM | POA: Diagnosis not present

## 2017-08-11 DIAGNOSIS — J449 Chronic obstructive pulmonary disease, unspecified: Secondary | ICD-10-CM | POA: Diagnosis not present

## 2017-08-11 NOTE — Progress Notes (Signed)
[facility]   PULMONARY SERVICE  PROGRESS NOTE  Date of Service: 08/11/2017  Tiffany NailerDeborah Parks Gordon  AOZ:[id number]:[id number]  DOB: 10/20/1950   DOA: 08/02/2017  Referring Physician: Carron CurieAli Hijazi, MD  HPI: Tiffany Gordon is a 67 y.o. female seen for follow up of Acute on Chronic Respiratory Failure.  Patient was about to do 1 hr on T collar today and that is about all she was able to do.  Right now is back on pressure support mode on 35% FiO2.  Medications: Reviewed on Rounds  Physical Exam:  Vitals:   Temperature 96.7 degrees pulse 87 respiratory rate 27 blood pressure 143/67 saturations 97%  Ventilator Settings  Mode of ventilation is pressure support FiO2 35% tidal volume 347 peep 5 pressure support 12  . General: Comfortable at this time . Eyes: Grossly normal lids, irises & conjunctiva . ENT: grossly tongue is normal . Neck: no obvious mass . Cardiovascular: S1 S2 normal no gallop . Respiratory:  No rhonchi or rales are noted at this time . Abdomen: soft . Skin: no rash seen on limited exam . Musculoskeletal: not rigid . Psychiatric:unable to assess . Neurologic: no seizure no involuntary movements         Lab Data:   Basic Metabolic Panel: Recent Labs  Lab 08/05/17 1004 08/06/17 1409  NA 136  --   K 3.0* 4.1  CL 91*  --   CO2 35*  --   GLUCOSE 121*  --   BUN 12  --   CREATININE 0.43*  --   CALCIUM 8.1*  --     Liver Function Tests: No results for input(s): AST, ALT, ALKPHOS, BILITOT, PROT, ALBUMIN in the last 168 hours. No results for input(s): LIPASE, AMYLASE in the last 168 hours. No results for input(s): AMMONIA in the last 168 hours.  CBC: Recent Labs  Lab 08/05/17 1004 08/08/17 0656  WBC 14.7* 18.6*  HGB 7.8* 7.7*  HCT 26.0* 26.6*  MCV 94.5 98.2  PLT 375 434*    Cardiac Enzymes: No results for input(s): CKTOTAL, CKMB, CKMBINDEX, TROPONINI in the last 168 hours.  BNP (last 3 results) Recent  Labs    08/05/17 1004  BNP 940.5*    ProBNP (last 3 results) No results for input(s): PROBNP in the last 8760 hours.  Radiological Exams: Dg Chest Port 1 View  Result Date: 08/10/2017 CLINICAL DATA:  67 year old female with a history EXAM: PORTABLE CHEST 1 VIEW COMPARISON:  08/05/2017, 08/03/2017 FINDINGS: Cardiomediastinal silhouette unchanged in size and contour, partially obscured by overlying lung and pleural disease. Unchanged tracheostomy tube. Unchanged gastric tube terminating out of the field of view. Interval removal of left IJ central venous catheter. There is a cylindrical radiopaque object projecting over the right heart border, not present on the comparison studies. Uncertain on this single frontal view of the AP location. Similar appearance of mixed interstitial and airspace opacities of the bilateral lungs, more prominent in lower lungs. Blunting of the bilateral costophrenic angles. Bilateral rib fractures again noted. IMPRESSION: Similar appearance of mixed interstitial and airspace opacities with bilateral pleural effusions. Interval removal of left IJ catheter. Unchanged tracheostomy tube and gastric tube. Cylindrical radiopaque object measuring approximately 2-3 cm projects over the right heart border. This is favored to be overlying the patient, however, is not located on this single view and a retained foreign body is not excluded. Recommend correlation with any prior intervention, as well as overlying material on the patient's chest.  Redemonstration of bilateral rib fractures. Electronically Signed   By: Gilmer Mor D.O.   On: 08/10/2017 09:10    Assessment/Plan Active Problems:   Acute on chronic respiratory failure with hypoxia (HCC)   Severe sepsis with septic shock (HCC)   Healthcare-associated pneumonia   Acute metabolic encephalopathy   Alcohol withdrawal seizure with complication, with unspecified complication (HCC)   Severe mitral valve regurgitation   COPD  (chronic obstructive pulmonary disease) (HCC)   1.  acute on chronic Respiratory failure with hypoxia continue with full supportive care wean on T collar as tolerated.   2. Severe sepsis with shock doing fairly well we will continue with present management.   3. Acute metabolic encephalopathy grossly unchanged will monitor  4. Healthcare associated pneumonia treated with antibiotics 5. Alcohol withdrawal seizures no active seizures at this stage  6. Severe mitral with Courage stable  7. COPD severe disease prognosis guarded   I have personally seen and evaluated the patient, evaluated laboratory and imaging results, formulated the assessment and plan and placed orders. The Patient requires high complexity decision making for assessment and support.  Case was discussed on Rounds with the Respiratory Therapy Staff  Yevonne Pax, MD [facility] Pulmonary Critical Care Medicine Sleep Medicine

## 2017-08-12 ENCOUNTER — Other Ambulatory Visit (HOSPITAL_COMMUNITY): Payer: Medicare Other

## 2017-08-12 DIAGNOSIS — G9341 Metabolic encephalopathy: Secondary | ICD-10-CM | POA: Diagnosis not present

## 2017-08-12 DIAGNOSIS — J9621 Acute and chronic respiratory failure with hypoxia: Secondary | ICD-10-CM | POA: Diagnosis not present

## 2017-08-12 DIAGNOSIS — J90 Pleural effusion, not elsewhere classified: Secondary | ICD-10-CM | POA: Diagnosis not present

## 2017-08-12 DIAGNOSIS — F10239 Alcohol dependence with withdrawal, unspecified: Secondary | ICD-10-CM | POA: Diagnosis not present

## 2017-08-12 DIAGNOSIS — A419 Sepsis, unspecified organism: Secondary | ICD-10-CM | POA: Diagnosis not present

## 2017-08-12 DIAGNOSIS — R569 Unspecified convulsions: Secondary | ICD-10-CM | POA: Diagnosis not present

## 2017-08-12 DIAGNOSIS — I340 Nonrheumatic mitral (valve) insufficiency: Secondary | ICD-10-CM | POA: Diagnosis not present

## 2017-08-12 DIAGNOSIS — R6521 Severe sepsis with septic shock: Secondary | ICD-10-CM | POA: Diagnosis not present

## 2017-08-12 DIAGNOSIS — J189 Pneumonia, unspecified organism: Secondary | ICD-10-CM | POA: Diagnosis not present

## 2017-08-12 DIAGNOSIS — J449 Chronic obstructive pulmonary disease, unspecified: Secondary | ICD-10-CM | POA: Diagnosis not present

## 2017-08-12 MED ORDER — GENERIC EXTERNAL MEDICATION
Status: DC
Start: ? — End: 2017-08-12

## 2017-08-12 NOTE — Progress Notes (Signed)
[facility]   PULMONARY SERVICE  PROGRESS NOTE  Date of Service: 08/12/2017  Tiffany Gordon  ZOX:[id number]  DOB: 11/17/1950   DOA: 08/02/2017  Referring Physician: Carron CurieAli Hijazi, MD  HPI: Tiffany Gordon is a 67 y.o. female seen for follow up of Acute on Chronic Respiratory Failure.  Patient right now is resting comfortably without distress.  She is on assist control mode was attempted on pressure support earlier today and apparently this morning patient did not tolerate the pressure support mode.  There was increased respiratory rate and increased work of breathing noted.  Therefore the patient was placed back on the ventilator.  I discussed this on rounds with respiratory therapy and we will hold off on weaning for today  Medications: Reviewed on Rounds  Physical Exam:  Vitals: Temperature 97.8 pulse 85 respiratory rate 27 blood pressure 129/64 saturations 95%  Ventilator Settings mode of ventilation assist control FiO2 35% tidal volume 387 PEEP 5  . General: Comfortable at this time . Eyes: Grossly normal lids, irises & conjunctiva . ENT: grossly tongue is normal . Neck: no obvious mass . Cardiovascular: S1 S2 normal no gallop . Respiratory: Coarse rhonchi are noted bilaterally. . Abdomen: soft . Skin: no rash seen on limited exam . Musculoskeletal: not rigid . Psychiatric:unable to assess . Neurologic: no seizure no involuntary movements         Lab Data:   Basic Metabolic Panel: Recent Labs  Lab 08/06/17 1409  K 4.1    Liver Function Tests: No results for input(s): AST, ALT, ALKPHOS, BILITOT, PROT, ALBUMIN in the last 168 hours. No results for input(s): LIPASE, AMYLASE in the last 168 hours. No results for input(s): AMMONIA in the last 168 hours.  CBC: Recent Labs  Lab 08/08/17 0656  WBC 18.6*  HGB 7.7*  HCT 26.6*  MCV 98.2  PLT 434*    Cardiac Enzymes: No results for input(s): CKTOTAL,  CKMB, CKMBINDEX, TROPONINI in the last 168 hours.  BNP (last 3 results) Recent Labs    08/05/17 1004  BNP 940.5*    ProBNP (last 3 results) No results for input(s): PROBNP in the last 8760 hours.  Radiological Exams: Ct Abdomen Wo Contrast  Result Date: 08/12/2017 CLINICAL DATA:  Acute respiratory failure, needs enteral feeding support. Preop planning for percutaneous gastrostomy. EXAM: CT ABDOMEN WITHOUT CONTRAST TECHNIQUE: Multidetector CT imaging of the abdomen was performed following the standard protocol without IV contrast. COMPARISON:  None. FINDINGS: Lower chest: Small pleural effusions. Patchy coarse airspace opacities in the lung bases, left greater than right. Cardiomegaly. Coronary calcifications. Hepatobiliary: Left lobe liver extends well below the costal margin. No focal lesion. Multiple subcentimeter partially calcified stones in the nondilated gallbladder. No biliary ductal dilatation evident. Pancreas: Unremarkable. No pancreatic ductal dilatation or surrounding inflammatory changes. Spleen: Normal in size without focal abnormality. Adrenals/Urinary Tract: Adrenal glands are unremarkable. Kidneys are normal, without renal calculi, focal lesion, or hydronephrosis. Stomach/Bowel: Nasogastric tube into the nondistended stomach. There is a safe left upper quadrant percutaneous window for gastrostomy placement. visualized portions of small bowel and colon are nondilated. Vascular/Lymphatic: Moderate calcified aortic plaque without aneurysm. No abdominal adenopathy localized. Other: No free air.  No ascites. Musculoskeletal: Spondylitic change in the visualized lower thoracic spine. No fracture or worrisome bone lesion. IMPRESSION: 1. There is a safe percutaneous window in the left upper quadrant for gastrostomy placement. Note that the left lobe of the liver extends well inferior to the costal margin.  2. Small bilateral pleural effusions and bibasilar pulmonary airspace opacities. 3.  Coronary and Aortic Atherosclerosis (ICD10-170.0). 4. Cholelithiasis. Electronically Signed   By: Corlis Leak M.D.   On: 08/12/2017 08:23    Assessment/Plan Active Problems:   Acute on chronic respiratory failure with hypoxia (HCC)   Severe sepsis with septic shock (HCC)   Healthcare-associated pneumonia   Acute metabolic encephalopathy   Alcohol withdrawal seizure with complication, with unspecified complication (HCC)   Severe mitral valve regurgitation   COPD (chronic obstructive pulmonary disease) (HCC)   1. Acute on chronic respiratory failure with hypoxia she had been weaning on pressure support the last several days today did not do well by even on pressure support.  Will hold off for weaning for today reassess the spontaneous breathing index in the morning and then try to wean.  Hemodynamically current patient is currently stable.  So we should be able to make an attempt. 2. Severe sepsis with shock hemodynamically stable we will continue with present management. 3. Healthcare associated pneumonia treated with antibiotics we will continue to follow. 4. Acute metabolic encephalopathy grossly unchanged 5. Alcohol withdrawal seizure no active seizures 6. Severe mitral regurg stable 7. COPD stable continue with present management limiting factor for weaning at this time   I have personally seen and evaluated the patient, evaluated laboratory and imaging results, formulated the assessment and plan and placed orders. The Patient requires high complexity decision making for assessment and support.  Case was discussed on Rounds with the Respiratory Therapy Staff time spent 35 minutes discussion on rounds with medical staff treatment team and review of the labs and data  Yevonne Pax, MD [facility] Pulmonary Critical Care Medicine Sleep Medicine

## 2017-08-13 ENCOUNTER — Other Ambulatory Visit (HOSPITAL_COMMUNITY): Payer: Medicare Other

## 2017-08-13 DIAGNOSIS — G9341 Metabolic encephalopathy: Secondary | ICD-10-CM | POA: Diagnosis not present

## 2017-08-13 DIAGNOSIS — J9621 Acute and chronic respiratory failure with hypoxia: Secondary | ICD-10-CM | POA: Diagnosis not present

## 2017-08-13 DIAGNOSIS — J449 Chronic obstructive pulmonary disease, unspecified: Secondary | ICD-10-CM | POA: Diagnosis not present

## 2017-08-13 DIAGNOSIS — F10239 Alcohol dependence with withdrawal, unspecified: Secondary | ICD-10-CM | POA: Diagnosis not present

## 2017-08-13 LAB — URINALYSIS, ROUTINE W REFLEX MICROSCOPIC
BILIRUBIN URINE: NEGATIVE
Glucose, UA: NEGATIVE mg/dL
Ketones, ur: NEGATIVE mg/dL
LEUKOCYTES UA: NEGATIVE
NITRITE: NEGATIVE
PH: 8 (ref 5.0–8.0)
Protein, ur: NEGATIVE mg/dL
RBC / HPF: >50 RBC/hpf — ABNORMAL HIGH (ref 0–5)
SPECIFIC GRAVITY, URINE: 1.018 (ref 1.005–1.030)

## 2017-08-13 LAB — COMPREHENSIVE METABOLIC PANEL
ALBUMIN: 2.1 g/dL — AB (ref 3.5–5.0)
ALT: 15 U/L (ref 0–44)
ANION GAP: 13 (ref 5–15)
AST: 25 U/L (ref 15–41)
Alkaline Phosphatase: 145 U/L — ABNORMAL HIGH (ref 38–126)
BUN: 19 mg/dL (ref 8–23)
CALCIUM: 8.8 mg/dL — AB (ref 8.9–10.3)
CO2: 35 mmol/L — ABNORMAL HIGH (ref 22–32)
Chloride: 91 mmol/L — ABNORMAL LOW (ref 98–111)
Creatinine, Ser: 0.39 mg/dL — ABNORMAL LOW (ref 0.44–1.00)
GFR calc non Af Amer: >60 mL/min (ref >60)
Glucose, Bld: 108 mg/dL — ABNORMAL HIGH (ref 70–99)
Potassium: 3 mmol/L — ABNORMAL LOW (ref 3.5–5.1)
Sodium: 139 mmol/L (ref 135–145)
Total Bilirubin: 0.4 mg/dL (ref 0.3–1.2)
Total Protein: 7.4 g/dL (ref 6.5–8.1)

## 2017-08-13 LAB — CBC
HCT: 26.4 % — ABNORMAL LOW (ref 36.0–46.0)
Hemoglobin: 7.8 g/dL — ABNORMAL LOW (ref 12.0–15.0)
MCH: 27.8 pg (ref 26.0–34.0)
MCHC: 29.5 g/dL — ABNORMAL LOW (ref 30.0–36.0)
MCV: 94 fL (ref 78.0–100.0)
PLATELETS: 440 10*3/uL — AB (ref 150–400)
RBC: 2.81 MIL/uL — AB (ref 3.87–5.11)
RDW: 17.4 % — ABNORMAL HIGH (ref 11.5–15.5)
WBC: 19.9 10*3/uL — AB (ref 4.0–10.5)

## 2017-08-13 LAB — VANCOMYCIN, TROUGH: Vancomycin Tr: 25 ug/mL (ref 15–20)

## 2017-08-13 MED ORDER — GENERIC EXTERNAL MEDICATION
Status: DC
Start: ? — End: 2017-08-13

## 2017-08-13 NOTE — Progress Notes (Signed)
Pulmonary Critical Care Medicine [facility] [place]   PULMONARY SERVICE  PROGRESS NOTE  Date of Service: 08/13/2017  Tiffany Gordon  ZOX:[id number]  DOB: 11/02/1950   DOA: 08/02/2017  Referring Physician: Carron Curie, MD  HPI: Tiffany Gordon is a 67 y.o. female seen for follow up of Acute on Chronic Respiratory Failure.  Patient is on full support right now patient's been on assist control mode on 28% oxygen.  Good volumes are noted  Medications: Reviewed on Rounds  Physical Exam:  Vitals: Temperature 98.2 pulse 97 respiratory rate 27 blood pressure 143/77 saturations 96%  Ventilator Settings mode of ventilation assist control FiO2 28% tidal volume 467 PEEP 5  . General: Comfortable at this time . Eyes: Grossly normal lids, irises & conjunctiva . ENT: grossly tongue is normal . Neck: no obvious mass . Cardiovascular: S1 S2 normal no gallop . Respiratory: Coarse breath sounds are noted bilaterally . Abdomen: soft . Skin: no rash seen on limited exam . Musculoskeletal: not rigid . Psychiatric:unable to assess . Neurologic: no seizure no involuntary movements         Lab Data:   Basic Metabolic Panel: Recent Labs  Lab 08/13/17 0654  NA 139  K 3.0*  CL 91*  CO2 35*  GLUCOSE 108*  BUN 19  CREATININE 0.39*  CALCIUM 8.8*    Liver Function Tests: Recent Labs  Lab 08/13/17 0654  AST 25  ALT 15  ALKPHOS 145*  BILITOT 0.4  PROT 7.4  ALBUMIN 2.1*   No results for input(s): LIPASE, AMYLASE in the last 168 hours. No results for input(s): AMMONIA in the last 168 hours.  CBC: Recent Labs  Lab 08/08/17 0656 08/13/17 0654  WBC 18.6* 19.9*  HGB 7.7* 7.8*  HCT 26.6* 26.4*  MCV 98.2 94.0  PLT 434* 440*    Cardiac Enzymes: No results for input(s): CKTOTAL, CKMB, CKMBINDEX, TROPONINI in the last 168 hours.  BNP (last 3 results) Recent Labs    08/05/17 1004  BNP 940.5*    ProBNP (last 3 results) No results for input(s): PROBNP in  the last 8760 hours.  Radiological Exams: Ct Abdomen Wo Contrast  Result Date: 08/12/2017 CLINICAL DATA:  Acute respiratory failure, needs enteral feeding support. Preop planning for percutaneous gastrostomy. EXAM: CT ABDOMEN WITHOUT CONTRAST TECHNIQUE: Multidetector CT imaging of the abdomen was performed following the standard protocol without IV contrast. COMPARISON:  None. FINDINGS: Lower chest: Small pleural effusions. Patchy coarse airspace opacities in the lung bases, left greater than right. Cardiomegaly. Coronary calcifications. Hepatobiliary: Left lobe liver extends well below the costal margin. No focal lesion. Multiple subcentimeter partially calcified stones in the nondilated gallbladder. No biliary ductal dilatation evident. Pancreas: Unremarkable. No pancreatic ductal dilatation or surrounding inflammatory changes. Spleen: Normal in size without focal abnormality. Adrenals/Urinary Tract: Adrenal glands are unremarkable. Kidneys are normal, without renal calculi, focal lesion, or hydronephrosis. Stomach/Bowel: Nasogastric tube into the nondistended stomach. There is a safe left upper quadrant percutaneous window for gastrostomy placement. visualized portions of small bowel and colon are nondilated. Vascular/Lymphatic: Moderate calcified aortic plaque without aneurysm. No abdominal adenopathy localized. Other: No free air.  No ascites. Musculoskeletal: Spondylitic change in the visualized lower thoracic spine. No fracture or worrisome bone lesion. IMPRESSION: 1. There is a safe percutaneous window in the left upper quadrant for gastrostomy placement. Note that the left lobe of the liver extends well inferior to the costal margin. 2. Small bilateral pleural effusions and bibasilar pulmonary airspace opacities. 3. Coronary and  Aortic Atherosclerosis (ICD10-170.0). 4. Cholelithiasis. Electronically Signed   By: Corlis Leak  Hassell M.D.   On: 08/12/2017 08:23   Dg Chest Port 1 View  Result Date:  08/13/2017 CLINICAL DATA:  Respiratory distress, fever EXAM: PORTABLE CHEST 1 VIEW COMPARISON:  08/02/2017 FINDINGS: Tracheostomy and NG tube remain in place, unchanged. Diffuse interstitial prominence and patchy bilateral airspace disease again noted, similar to prior study. Underlying COPD. Heart is borderline in size. Possible small effusions. Previously seen density projecting over the right heart border no longer visualized and was likely external to the patient. IMPRESSION: No significant change since prior study. Electronically Signed   By: Charlett NoseKevin  Dover M.D.   On: 08/13/2017 09:58    Assessment/Plan Active Problems:   Acute on chronic respiratory failure with hypoxia (HCC)   Severe sepsis with septic shock (HCC)   Healthcare-associated pneumonia   Acute metabolic encephalopathy   Alcohol withdrawal seizure with complication, with unspecified complication (HCC)   Severe mitral valve regurgitation   COPD (chronic obstructive pulmonary disease) (HCC)   1. Acute on chronic respiratory failure with hypoxia she is not been weaning for the last couple of days because of increased work of breathing noted.  Last CT that was done had shown some lower lobe densities.  Chest x-ray was ordered and his chest x-ray shows patchy bilateral airspace disease consistent with pneumonitis.  She has been treated with antibiotics we will discuss with the primary care physician regarding antibiotic choice.  Right now may need an adjustment since the infiltrates are still present. 2. Healthcare associated pneumonia as noted above chest x-ray still showing some bilateral infiltrates.  Will discuss further with primary care team regarding antibiotic choice.  Also would consider follow-up sputum cultures. 3. Severe sepsis with shock treated with antibiotics we will continue to monitor hemodynamically she is stable. 4. Metabolic encephalopathy grossly unchanged 5. Alcohol withdrawal seizures no active seizures 6. Severe  mitral regurg stable 7. COPD advanced disease   I have personally seen and evaluated the patient, evaluated laboratory and imaging results, formulated the assessment and plan and placed orders. The Patient requires high complexity decision making for assessment and support.  Case was discussed on Rounds with the Respiratory Therapy Staff time spent 35 minutes in review of the medical record ordering lab work discussion with primary care team  Yevonne PaxSaadat A Britney Newstrom, MD [facility] Pulmonary Critical Care Medicine Sleep Medicine

## 2017-08-13 NOTE — Consult Note (Signed)
Chief Complaint: Patient was seen in consultation today for percutaneous gastric tube placement at the request of Dr Ardeth Sportsman   Supervising Physician: Simonne Come  Patient Status: Select IP  History of Present Illness: Tiffany Gordon is a 67 y.o. female   Acute on chronic reap failure Intubated/vent Moving arms --- no other response Alc withdrawal; Seizures Malnutrition Encephalopathy Dysphagia; FTT  Request for percutaneous gastric tube placement Imaging has been approved by Dr Deanne Coffer  Wbc 19.9 today (18); Afeb Will wait to trend down     Past Medical History:  Diagnosis Date  . Acute metabolic encephalopathy 08/07/2017  . Acute on chronic respiratory failure with hypoxia (HCC) 08/07/2017  . Alcohol withdrawal seizure with complication, with unspecified complication (HCC) 08/07/2017  . Anxiety disorder   . COPD (chronic obstructive pulmonary disease) (HCC)   . GERD without esophagitis   . Healthcare-associated pneumonia 08/07/2017  . History of esophagogastroduodenoscopy (EGD)   . Hypertension   . Severe mitral valve regurgitation 08/07/2017  . Severe sepsis with septic shock (HCC) 08/07/2017    Past Surgical History:  Procedure Laterality Date  . COLONOSCOPY    . COLONOSCOPY WITH ESOPHAGOGASTRODUODENOSCOPY (EGD)    . EYE SURGERY    . TUBAL LIGATION      Allergies: Patient has no allergy information on record.  Medications: Prior to Admission medications   Not on File     Family History  Family history unknown: Yes    Social History   Socioeconomic History  . Marital status: Married    Spouse name: Not on file  . Number of children: Not on file  . Years of education: Not on file  . Highest education level: Not on file  Occupational History  . Not on file  Social Needs  . Financial resource strain: Not on file  . Food insecurity:    Worry: Not on file    Inability: Not on file  . Transportation needs:    Medical: Not on file   Non-medical: Not on file  Tobacco Use  . Smoking status: Former Games developer  . Smokeless tobacco: Never Used  Substance and Sexual Activity  . Alcohol use: Not Currently  . Drug use: Not Currently  . Sexual activity: Not Currently  Lifestyle  . Physical activity:    Days per week: Not on file    Minutes per session: Not on file  . Stress: Not on file  Relationships  . Social connections:    Talks on phone: Not on file    Gets together: Not on file    Attends religious service: Not on file    Active member of club or organization: Not on file    Attends meetings of clubs or organizations: Not on file    Relationship status: Not on file  Other Topics Concern  . Not on file  Social History Narrative  . Not on file     Review of Systems: A 12 point ROS discussed and pertinent positives are indicated in the HPI above.  All other systems are negative.  Review of Systems  Respiratory:       Vent    Vital Signs: There were no vitals taken for this visit.  Physical Exam  Cardiovascular: Normal rate and regular rhythm.  Pulmonary/Chest:  vent  Abdominal: Soft.  Musculoskeletal:  Moving arms spontaneously  Neurological:  No response to me  Skin: Skin is warm.  Psychiatric:  Consented with husband via phone  Vitals reviewed.  Imaging: Ct Abdomen Wo Contrast  Result Date: 08/12/2017 CLINICAL DATA:  Acute respiratory failure, needs enteral feeding support. Preop planning for percutaneous gastrostomy. EXAM: CT ABDOMEN WITHOUT CONTRAST TECHNIQUE: Multidetector CT imaging of the abdomen was performed following the standard protocol without IV contrast. COMPARISON:  None. FINDINGS: Lower chest: Small pleural effusions. Patchy coarse airspace opacities in the lung bases, left greater than right. Cardiomegaly. Coronary calcifications. Hepatobiliary: Left lobe liver extends well below the costal margin. No focal lesion. Multiple subcentimeter partially calcified stones in the  nondilated gallbladder. No biliary ductal dilatation evident. Pancreas: Unremarkable. No pancreatic ductal dilatation or surrounding inflammatory changes. Spleen: Normal in size without focal abnormality. Adrenals/Urinary Tract: Adrenal glands are unremarkable. Kidneys are normal, without renal calculi, focal lesion, or hydronephrosis. Stomach/Bowel: Nasogastric tube into the nondistended stomach. There is a safe left upper quadrant percutaneous window for gastrostomy placement. visualized portions of small bowel and colon are nondilated. Vascular/Lymphatic: Moderate calcified aortic plaque without aneurysm. No abdominal adenopathy localized. Other: No free air.  No ascites. Musculoskeletal: Spondylitic change in the visualized lower thoracic spine. No fracture or worrisome bone lesion. IMPRESSION: 1. There is a safe percutaneous window in the left upper quadrant for gastrostomy placement. Note that the left lobe of the liver extends well inferior to the costal margin. 2. Small bilateral pleural effusions and bibasilar pulmonary airspace opacities. 3. Coronary and Aortic Atherosclerosis (ICD10-170.0). 4. Cholelithiasis. Electronically Signed   By: Corlis Leak  Hassell M.D.   On: 08/12/2017 08:23   Dg Abd 1 View  Result Date: 08/02/2017 CLINICAL DATA:  Encounter for imaging study to confirm nasogastric (NG) tube placement EXAM: ABDOMEN - 1 VIEW COMPARISON:  None. FINDINGS: Nasogastric tube tip overlies the level of the stomach. Visualized bowel gas pattern is nonobstructed. IMPRESSION: Nasogastric tube tip to the level of the stomach. Electronically Signed   By: Norva PavlovElizabeth  Brown M.D.   On: 08/02/2017 19:19   Dg Chest Port 1 View  Result Date: 08/10/2017 CLINICAL DATA:  67 year old female with a history EXAM: PORTABLE CHEST 1 VIEW COMPARISON:  08/05/2017, 08/03/2017 FINDINGS: Cardiomediastinal silhouette unchanged in size and contour, partially obscured by overlying lung and pleural disease. Unchanged tracheostomy tube.  Unchanged gastric tube terminating out of the field of view. Interval removal of left IJ central venous catheter. There is a cylindrical radiopaque object projecting over the right heart border, not present on the comparison studies. Uncertain on this single frontal view of the AP location. Similar appearance of mixed interstitial and airspace opacities of the bilateral lungs, more prominent in lower lungs. Blunting of the bilateral costophrenic angles. Bilateral rib fractures again noted. IMPRESSION: Similar appearance of mixed interstitial and airspace opacities with bilateral pleural effusions. Interval removal of left IJ catheter. Unchanged tracheostomy tube and gastric tube. Cylindrical radiopaque object measuring approximately 2-3 cm projects over the right heart border. This is favored to be overlying the patient, however, is not located on this single view and a retained foreign body is not excluded. Recommend correlation with any prior intervention, as well as overlying material on the patient's chest. Redemonstration of bilateral rib fractures. Electronically Signed   By: Gilmer MorJaime  Wagner D.O.   On: 08/10/2017 09:10   Dg Chest Port 1 View  Result Date: 08/05/2017 CLINICAL DATA:  Followup for pleural effusions and edema. EXAM: PORTABLE CHEST 1 VIEW COMPARISON:  08/03/2017 FINDINGS: Bilateral pleural effusions obscure the hemidiaphragms. There is hazy airspace opacity with interstitial prominence in the lungs, greater on the right. The appearance is unchanged from the prior  study allowing for differences in patient positioning and technique. No pneumothorax. Tracheostomy tube, nasogastric tube and left internal jugular central venous line are stable. IMPRESSION: 1. No significant change when compared the prior study. 2. Bilateral pleural effusions. Interstitial and hazy airspace lung opacities, most evident on the right. Findings are consistent with asymmetric edema. Electronically Signed   By: Amie Portland M.D.   On: 08/05/2017 15:52   Dg Chest Port 1 View  Result Date: 08/03/2017 CLINICAL DATA:  Respiratory failure. EXAM: PORTABLE CHEST 1 VIEW COMPARISON:  No prior. FINDINGS: Tracheostomy tube, NG tube, left IJ line in stable position. Stable cardiomegaly. Diffuse bilateral pulmonary infiltrates and bilateral pleural effusions again noted. Findings suggest CHF. Bilateral pneumonia cannot be excluded. Thoracic spine scoliosis and degenerative change. Small radiopacities noted over the upper thoracic spine. This may represent old contrast media or small metallic densities. Carotid vascular calcification. IMPRESSION: 1.  Tracheostomy tube, NG tube, left IJ line in stable position. 2. Cardiomegaly with bilateral pulmonary infiltrates/edema and bilateral pleural effusions suggesting CHF. Similar findings noted on prior exam. Bilateral pneumonia cannot be excluded. 3.  Carotid vascular disease. Electronically Signed   By: Maisie Fus  Register   On: 08/03/2017 08:33    Labs:  CBC: Recent Labs    08/02/17 1900 08/05/17 1004 08/08/17 0656 08/13/17 0654  WBC 20.6* 14.7* 18.6* 19.9*  HGB 8.8* 7.8* 7.7* 7.8*  HCT 30.1* 26.0* 26.6* 26.4*  PLT 360 375 434* 440*    COAGS: Recent Labs    08/02/17 1900  INR 1.15    BMP: Recent Labs    08/02/17 1900 08/04/17 0753 08/05/17 1004 08/06/17 1409 08/13/17 0654  NA 137 135 136  --  139  K 3.8 3.1* 3.0* 4.1 3.0*  CL 97* 95* 91*  --  91*  CO2 31 32 35*  --  35*  GLUCOSE 69 105* 121*  --  108*  BUN 15 15 12   --  19  CALCIUM 8.5* 8.0* 8.1*  --  8.8*  CREATININE 0.38* 0.42* 0.43*  --  0.39*  GFRNONAA >60 >60 >60  --  >60  GFRAA >60 >60 >60  --  >60    LIVER FUNCTION TESTS: Recent Labs    08/02/17 1900 08/13/17 0654  BILITOT 0.5 0.4  AST 34 25  ALT 23 15  ALKPHOS 138* 145*  PROT 6.4* 7.4  ALBUMIN 1.9* 2.1*    TUMOR MARKERS: No results for input(s): AFPTM, CEA, CA199, CHROMGRNA in the last 8760 hours.  Assessment and  Plan:  Dysphagia Malnutrition FTT Encephalopathy Scheduled for percutaneous gastric tube placement --- when wbc trends down Risks and benefits discussed with the patient's husband via phone including, but not limited to the need for a barium enema during the procedure, bleeding, infection, peritonitis, or damage to adjacent structures.  All of his questions were answered, he is agreeable to proceed. Consent signed and in chart.   Thank you for this interesting consult.  I greatly enjoyed meeting Tiffany Gordon and look forward to participating in their care.  A copy of this report was sent to the requesting provider on this date.  Electronically Signed: Robet Leu, PA-C 08/13/2017, 8:20 AM   I spent a total of 40 Minutes    in face to face in clinical consultation, greater than 50% of which was counseling/coordinating care for perc gastric tube

## 2017-08-14 ENCOUNTER — Other Ambulatory Visit (HOSPITAL_COMMUNITY): Payer: Medicare Other

## 2017-08-14 DIAGNOSIS — J9621 Acute and chronic respiratory failure with hypoxia: Secondary | ICD-10-CM | POA: Diagnosis not present

## 2017-08-14 DIAGNOSIS — J449 Chronic obstructive pulmonary disease, unspecified: Secondary | ICD-10-CM | POA: Diagnosis not present

## 2017-08-14 DIAGNOSIS — F10239 Alcohol dependence with withdrawal, unspecified: Secondary | ICD-10-CM | POA: Diagnosis not present

## 2017-08-14 DIAGNOSIS — G9341 Metabolic encephalopathy: Secondary | ICD-10-CM | POA: Diagnosis not present

## 2017-08-14 LAB — CBC
HCT: 27 % — ABNORMAL LOW (ref 36.0–46.0)
Hemoglobin: 7.9 g/dL — ABNORMAL LOW (ref 12.0–15.0)
MCH: 27.6 pg (ref 26.0–34.0)
MCHC: 29.3 g/dL — ABNORMAL LOW (ref 30.0–36.0)
MCV: 94.4 fL (ref 78.0–100.0)
PLATELETS: 382 10*3/uL (ref 150–400)
RBC: 2.86 MIL/uL — ABNORMAL LOW (ref 3.87–5.11)
RDW: 17.3 % — AB (ref 11.5–15.5)
WBC: 14.8 10*3/uL — AB (ref 4.0–10.5)

## 2017-08-14 LAB — URINE CULTURE: Culture: NO GROWTH

## 2017-08-14 LAB — POTASSIUM: POTASSIUM: 3.4 mmol/L — AB (ref 3.5–5.1)

## 2017-08-14 NOTE — Progress Notes (Signed)
Pulmonary Critical Care Medicine [facility] [place]   PULMONARY SERVICE  PROGRESS NOTE  Date of Service: 08/14/2017  Tiffany Gordon  ZOX:[id number]  DOB: August 02, 1950   DOA: 08/02/2017  Referring Physician: Carron Curie, MD  HPI: Tiffany Gordon is a 67 y.o. female seen for follow up of Acute on Chronic Respiratory Failure.  She has not been tolerating aggressive weaning she did wean today for about 7.5 hr has only been able to tolerate pressure support not be able tolerate being off the ventilator.  Currently was resting on pressure assist-control mode  Medications: Reviewed on Rounds  Physical Exam:  Vitals:  Temperature 97.2 degrees pulse 90 respiratory rate 30 blood pressure 123/64 saturations 95%  Ventilator Settings  Mode of ventilation pressure assist-control FiO2 30% tidal volume 417 peep 5  . General: Comfortable at this time . Eyes: Grossly normal lids, irises & conjunctiva . ENT: grossly tongue is normal . Neck: no obvious mass . Cardiovascular: S1 S2 normal no gallop . Respiratory:  Coarse rhonchi are noted . Abdomen: soft . Skin: no rash seen on limited exam . Musculoskeletal: not rigid . Psychiatric:unable to assess . Neurologic: no seizure no involuntary movements         Lab Data:   Basic Metabolic Panel: Recent Labs  Lab 08/13/17 0654 08/14/17 0557  NA 139  --   K 3.0* 3.4*  CL 91*  --   CO2 35*  --   GLUCOSE 108*  --   BUN 19  --   CREATININE 0.39*  --   CALCIUM 8.8*  --     Liver Function Tests: Recent Labs  Lab 08/13/17 0654  AST 25  ALT 15  ALKPHOS 145*  BILITOT 0.4  PROT 7.4  ALBUMIN 2.1*   No results for input(s): LIPASE, AMYLASE in the last 168 hours. No results for input(s): AMMONIA in the last 168 hours.  CBC: Recent Labs  Lab 08/08/17 0656 08/13/17 0654 08/14/17 0628  WBC 18.6* 19.9* 14.8*  HGB 7.7* 7.8* 7.9*  HCT 26.6* 26.4* 27.0*  MCV 98.2 94.0 94.4  PLT 434* 440* 382    Cardiac Enzymes: No  results for input(s): CKTOTAL, CKMB, CKMBINDEX, TROPONINI in the last 168 hours.  BNP (last 3 results) Recent Labs    08/05/17 1004  BNP 940.5*    ProBNP (last 3 results) No results for input(s): PROBNP in the last 8760 hours.  Radiological Exams: Dg Chest Port 1 View  Result Date: 08/13/2017 CLINICAL DATA:  Respiratory distress, fever EXAM: PORTABLE CHEST 1 VIEW COMPARISON:  08/02/2017 FINDINGS: Tracheostomy and NG tube remain in place, unchanged. Diffuse interstitial prominence and patchy bilateral airspace disease again noted, similar to prior study. Underlying COPD. Heart is borderline in size. Possible small effusions. Previously seen density projecting over the right heart border no longer visualized and was likely external to the patient. IMPRESSION: No significant change since prior study. Electronically Signed   By: Charlett Nose M.D.   On: 08/13/2017 09:58   Dg Abd Portable 1v  Result Date: 08/14/2017 CLINICAL DATA:  Impaired nasogastric feeding tube EXAM: PORTABLE ABDOMEN - 1 VIEW COMPARISON:  08/12/2017 CT, radiograph 08/02/2017 FINDINGS: Small left pleural effusion and left basilar air space disease. Esophageal tube tip overlies the distal stomach, side-port over the gastric body. Mild diffuse gaseous dilatation of bowel remains. IMPRESSION: Esophageal tube tip overlies the distal stomach. Electronically Signed   By: Jasmine Pang M.D.   On: 08/14/2017 04:00     Assessment/Plan  Active Problems:   Acute on chronic respiratory failure with hypoxia (HCC)   Severe sepsis with septic shock (HCC)   Healthcare-associated pneumonia   Acute metabolic encephalopathy   Alcohol withdrawal seizure with complication, with unspecified complication (HCC)   Severe mitral valve regurgitation   COPD (chronic obstructive pulmonary disease) (HCC)   1.  acute on chronic Respiratory failure with hypoxia will continue with the a making attempts at pressure support wean.  If she was able to  tolerate 7.5 hr today tomorrow we should be able to advance to 12 hr hopefully 2. Severe sepsis with shock she is hemodynamically stable continue present management.   3. Healthcare associated pneumonia treated with antibiotics appears to be clinically improving with the last chest x-ray however showing diffuse interstitial prominence.  A this may actually represent fluid 4. Acute metabolic encephalopathy grossly is unchanged 5. Alcohol withdrawal stable  6. Severe mitral regurg grossly unchanged  7. COPD we will continue with the present management.   I have personally seen and evaluated the patient, evaluated laboratory and imaging results, formulated the assessment and plan and placed orders. The Patient requires high complexity decision making for assessment and support.  Case was discussed on Rounds with the Respiratory Therapy Staff  Yevonne PaxSaadat A Khan, MD [facility] Pulmonary Critical Care Medicine Sleep Medicine

## 2017-08-15 ENCOUNTER — Other Ambulatory Visit (HOSPITAL_COMMUNITY): Payer: Medicare Other

## 2017-08-15 ENCOUNTER — Encounter (HOSPITAL_COMMUNITY): Payer: Self-pay | Admitting: Interventional Radiology

## 2017-08-15 DIAGNOSIS — J9621 Acute and chronic respiratory failure with hypoxia: Secondary | ICD-10-CM | POA: Diagnosis not present

## 2017-08-15 DIAGNOSIS — J449 Chronic obstructive pulmonary disease, unspecified: Secondary | ICD-10-CM | POA: Diagnosis not present

## 2017-08-15 DIAGNOSIS — G9341 Metabolic encephalopathy: Secondary | ICD-10-CM | POA: Diagnosis not present

## 2017-08-15 DIAGNOSIS — F10239 Alcohol dependence with withdrawal, unspecified: Secondary | ICD-10-CM | POA: Diagnosis not present

## 2017-08-15 HISTORY — PX: IR GASTROSTOMY TUBE MOD SED: IMG625

## 2017-08-15 LAB — VANCOMYCIN, TROUGH: Vancomycin Tr: 10 ug/mL — ABNORMAL LOW (ref 15–20)

## 2017-08-15 LAB — CBC
HEMATOCRIT: 27.6 % — AB (ref 36.0–46.0)
HEMOGLOBIN: 7.9 g/dL — AB (ref 12.0–15.0)
MCH: 27.1 pg (ref 26.0–34.0)
MCHC: 28.6 g/dL — AB (ref 30.0–36.0)
MCV: 94.5 fL (ref 78.0–100.0)
Platelets: 403 10*3/uL — ABNORMAL HIGH (ref 150–400)
RBC: 2.92 MIL/uL — ABNORMAL LOW (ref 3.87–5.11)
RDW: 17.3 % — AB (ref 11.5–15.5)
WBC: 14.7 10*3/uL — ABNORMAL HIGH (ref 4.0–10.5)

## 2017-08-15 LAB — BASIC METABOLIC PANEL
ANION GAP: 11 (ref 5–15)
BUN: 21 mg/dL (ref 8–23)
CO2: 34 mmol/L — AB (ref 22–32)
Calcium: 9 mg/dL (ref 8.9–10.3)
Chloride: 96 mmol/L — ABNORMAL LOW (ref 98–111)
Creatinine, Ser: 0.51 mg/dL (ref 0.44–1.00)
GFR calc Af Amer: >60 mL/min (ref >60)
GFR calc non Af Amer: >60 mL/min (ref >60)
GLUCOSE: 90 mg/dL (ref 70–99)
POTASSIUM: 3.5 mmol/L (ref 3.5–5.1)
Sodium: 141 mmol/L (ref 135–145)

## 2017-08-15 LAB — MAGNESIUM: Magnesium: 1.7 mg/dL (ref 1.7–2.4)

## 2017-08-15 LAB — CULTURE, RESPIRATORY

## 2017-08-15 LAB — PROTIME-INR
INR: 1.19
Prothrombin Time: 15 seconds (ref 11.4–15.2)

## 2017-08-15 LAB — CULTURE, RESPIRATORY W GRAM STAIN

## 2017-08-15 MED ORDER — FENTANYL CITRATE (PF) 100 MCG/2ML IJ SOLN
INTRAMUSCULAR | Status: AC | PRN
  Administered 2017-08-15: 50 ug via INTRAVENOUS

## 2017-08-15 MED ORDER — MIDAZOLAM HCL 2 MG/2ML IJ SOLN
INTRAMUSCULAR | Status: AC | PRN
  Administered 2017-08-15: 1 mg via INTRAVENOUS

## 2017-08-15 MED ORDER — LIDOCAINE HCL 1 % IJ SOLN
INTRAMUSCULAR | Status: AC
  Filled 2017-08-15: qty 20

## 2017-08-15 MED ORDER — IOPAMIDOL (ISOVUE-300) INJECTION 61%
INTRAVENOUS | Status: AC
  Administered 2017-08-15: 15 mL
  Filled 2017-08-15: qty 50

## 2017-08-15 MED ORDER — GLUCAGON HCL RDNA (DIAGNOSTIC) 1 MG IJ SOLR
INTRAMUSCULAR | Status: AC | PRN
  Administered 2017-08-15: 50 mg via INTRAVENOUS

## 2017-08-15 MED ORDER — LIDOCAINE HCL (PF) 1 % IJ SOLN
INTRAMUSCULAR | Status: AC | PRN
  Administered 2017-08-15: 5 mL

## 2017-08-15 MED ORDER — GLUCAGON HCL RDNA (DIAGNOSTIC) 1 MG IJ SOLR
INTRAMUSCULAR | Status: AC
  Filled 2017-08-15: qty 1

## 2017-08-15 MED ORDER — FENTANYL CITRATE (PF) 100 MCG/2ML IJ SOLN
INTRAMUSCULAR | Status: AC
  Filled 2017-08-15: qty 2

## 2017-08-15 MED ORDER — MIDAZOLAM HCL 2 MG/2ML IJ SOLN
INTRAMUSCULAR | Status: AC
  Filled 2017-08-15: qty 2

## 2017-08-15 NOTE — Procedures (Signed)
20 Fr pull through G tube EBL 0 Comp 0 

## 2017-08-15 NOTE — Progress Notes (Signed)
Pulmonary Critical Care Medicine [facility] [place]   PULMONARY SERVICE  PROGRESS NOTE  Date of Service: 08/15/2017  Tiffany Gordon  ZOX:[id number]  DOB: 1950/04/24   DOA: 08/02/2017  Referring Physician: Carron Curie, MD  HPI: Tiffany Gordon is a 67 y.o. female seen for follow up of Acute on Chronic Respiratory Failure.  She remains on pressure support has been weaning for about goal today is for 12 hours.  She is been tolerating it fairly well.  Secretions are fair to moderate at this time.  Medications: Reviewed on Rounds  Physical Exam:  Vitals: Temperature 97.6 pulse 87 respiratory 39 blood pressure 119/60 saturations 95%  Ventilator Settings mode of ventilation pressure support FiO2 28% tidal volume is 345 pressure support 12 PEEP 5  . General: Comfortable at this time . Eyes: Grossly normal lids, irises & conjunctiva . ENT: grossly tongue is normal . Neck: no obvious mass . Cardiovascular: S1 S2 normal no gallop . Respiratory: No rhonchi or rales are noted at this time . Abdomen: soft . Skin: no rash seen on limited exam . Musculoskeletal: not rigid . Psychiatric:unable to assess . Neurologic: no seizure no involuntary movements         Lab Data:   Basic Metabolic Panel: Recent Labs  Lab 08/13/17 0654 08/14/17 0557 08/15/17 0702  NA 139  --  141  K 3.0* 3.4* 3.5  CL 91*  --  96*  CO2 35*  --  34*  GLUCOSE 108*  --  90  BUN 19  --  21  CREATININE 0.39*  --  0.51  CALCIUM 8.8*  --  9.0  MG  --   --  1.7    Liver Function Tests: Recent Labs  Lab 08/13/17 0654  AST 25  ALT 15  ALKPHOS 145*  BILITOT 0.4  PROT 7.4  ALBUMIN 2.1*   No results for input(s): LIPASE, AMYLASE in the last 168 hours. No results for input(s): AMMONIA in the last 168 hours.  CBC: Recent Labs  Lab 08/13/17 0654 08/14/17 0628 08/15/17 0702  WBC 19.9* 14.8* 14.7*  HGB 7.8* 7.9* 7.9*  HCT 26.4* 27.0* 27.6*  MCV 94.0 94.4 94.5  PLT 440* 382 403*     Cardiac Enzymes: No results for input(s): CKTOTAL, CKMB, CKMBINDEX, TROPONINI in the last 168 hours.  BNP (last 3 results) Recent Labs    08/05/17 1004  BNP 940.5*    ProBNP (last 3 results) No results for input(s): PROBNP in the last 8760 hours.  Radiological Exams: Dg Abd Portable 1v  Result Date: 08/14/2017 CLINICAL DATA:  Impaired nasogastric feeding tube EXAM: PORTABLE ABDOMEN - 1 VIEW COMPARISON:  08/12/2017 CT, radiograph 08/02/2017 FINDINGS: Small left pleural effusion and left basilar air space disease. Esophageal tube tip overlies the distal stomach, side-port over the gastric body. Mild diffuse gaseous dilatation of bowel remains. IMPRESSION: Esophageal tube tip overlies the distal stomach. Electronically Signed   By: Jasmine Pang M.D.   On: 08/14/2017 04:00    Assessment/Plan Active Problems:   Acute on chronic respiratory failure with hypoxia (HCC)   Severe sepsis with septic shock (HCC)   Healthcare-associated pneumonia   Acute metabolic encephalopathy   Alcohol withdrawal seizure with complication, with unspecified complication (HCC)   Severe mitral valve regurgitation   COPD (chronic obstructive pulmonary disease) (HCC)   1. Acute on chronic respiratory failure with hypoxia we will continue with weaning on pressure support continue secretion management pulmonary toilet.  As mentioned the goal  is for 12 hours. 2. Sepsis with shock hemodynamically stable we will continue present management. 3. Healthcare associated pneumonia treated with antibiotics 4. Acute metabolic encephalopathy clinically stable Monitor 5. Alcohol withdrawal no seizures noted at this time. 6. COPD severe disease we will monitor   I have personally seen and evaluated the patient, evaluated laboratory and imaging results, formulated the assessment and plan and placed orders. The Patient requires high complexity decision making for assessment and support.  Case was discussed on Rounds  with the Respiratory Therapy Staff  Yevonne PaxSaadat A Khan, MD [facility] Pulmonary Critical Care Medicine Sleep Medicine

## 2017-08-16 LAB — BASIC METABOLIC PANEL
Anion gap: 13 (ref 5–15)
BUN: 12 mg/dL (ref 8–23)
CALCIUM: 8.9 mg/dL (ref 8.9–10.3)
CHLORIDE: 96 mmol/L — AB (ref 98–111)
CO2: 31 mmol/L (ref 22–32)
CREATININE: 0.43 mg/dL — AB (ref 0.44–1.00)
Glucose, Bld: 82 mg/dL (ref 70–99)
Potassium: 3.1 mmol/L — ABNORMAL LOW (ref 3.5–5.1)
SODIUM: 140 mmol/L (ref 135–145)

## 2017-08-16 LAB — MAGNESIUM: Magnesium: 2.1 mg/dL (ref 1.7–2.4)

## 2017-08-17 LAB — BASIC METABOLIC PANEL
Anion gap: 7 (ref 5–15)
BUN: 11 mg/dL (ref 8–23)
CALCIUM: 9.1 mg/dL (ref 8.9–10.3)
CHLORIDE: 94 mmol/L — AB (ref 98–111)
CO2: 34 mmol/L — ABNORMAL HIGH (ref 22–32)
CREATININE: 0.57 mg/dL (ref 0.44–1.00)
GFR calc Af Amer: >60 mL/min (ref >60)
Glucose, Bld: 101 mg/dL — ABNORMAL HIGH (ref 70–99)
Potassium: 3.8 mmol/L (ref 3.5–5.1)
SODIUM: 135 mmol/L (ref 135–145)

## 2017-08-17 LAB — CBC
HEMATOCRIT: 28 % — AB (ref 36.0–46.0)
HEMOGLOBIN: 8.1 g/dL — AB (ref 12.0–15.0)
MCH: 26.9 pg (ref 26.0–34.0)
MCHC: 28.9 g/dL — AB (ref 30.0–36.0)
MCV: 93 fL (ref 78.0–100.0)
Platelets: 401 10*3/uL — ABNORMAL HIGH (ref 150–400)
RBC: 3.01 MIL/uL — ABNORMAL LOW (ref 3.87–5.11)
RDW: 17.3 % — ABNORMAL HIGH (ref 11.5–15.5)
WBC: 12.6 10*3/uL — ABNORMAL HIGH (ref 4.0–10.5)

## 2017-08-18 ENCOUNTER — Other Ambulatory Visit (HOSPITAL_COMMUNITY): Payer: Medicare Other

## 2017-08-18 LAB — CULTURE, BLOOD (ROUTINE X 2)
CULTURE: NO GROWTH
Culture: NO GROWTH
Special Requests: ADEQUATE

## 2017-08-18 MED ORDER — GENERIC EXTERNAL MEDICATION
Status: DC
Start: ? — End: 2017-08-18

## 2017-08-19 ENCOUNTER — Other Ambulatory Visit (HOSPITAL_COMMUNITY): Payer: Medicare Other

## 2017-08-20 DIAGNOSIS — J90 Pleural effusion, not elsewhere classified: Secondary | ICD-10-CM | POA: Diagnosis not present

## 2017-08-20 DIAGNOSIS — J9621 Acute and chronic respiratory failure with hypoxia: Secondary | ICD-10-CM | POA: Diagnosis not present

## 2017-08-20 DIAGNOSIS — F10239 Alcohol dependence with withdrawal, unspecified: Secondary | ICD-10-CM | POA: Diagnosis not present

## 2017-08-20 DIAGNOSIS — R569 Unspecified convulsions: Secondary | ICD-10-CM | POA: Diagnosis not present

## 2017-08-20 DIAGNOSIS — J449 Chronic obstructive pulmonary disease, unspecified: Secondary | ICD-10-CM | POA: Diagnosis not present

## 2017-08-20 DIAGNOSIS — J189 Pneumonia, unspecified organism: Secondary | ICD-10-CM | POA: Diagnosis not present

## 2017-08-20 DIAGNOSIS — I340 Nonrheumatic mitral (valve) insufficiency: Secondary | ICD-10-CM | POA: Diagnosis not present

## 2017-08-20 DIAGNOSIS — G9341 Metabolic encephalopathy: Secondary | ICD-10-CM | POA: Diagnosis not present

## 2017-08-20 LAB — CBC
HCT: 28.3 % — ABNORMAL LOW (ref 36.0–46.0)
HEMATOCRIT: 26.1 % — AB (ref 36.0–46.0)
Hemoglobin: 7.4 g/dL — ABNORMAL LOW (ref 12.0–15.0)
Hemoglobin: 8.1 g/dL — ABNORMAL LOW (ref 12.0–15.0)
MCH: 26.8 pg (ref 26.0–34.0)
MCH: 26.9 pg (ref 26.0–34.0)
MCHC: 28.4 g/dL — AB (ref 30.0–36.0)
MCHC: 28.6 g/dL — AB (ref 30.0–36.0)
MCV: 94.6 fL (ref 78.0–100.0)
MCV: 94 fL (ref 78.0–100.0)
PLATELETS: 383 10*3/uL (ref 150–400)
PLATELETS: 262 10*3/uL (ref 150–400)
RBC: 2.76 MIL/uL — ABNORMAL LOW (ref 3.87–5.11)
RBC: 3.01 MIL/uL — ABNORMAL LOW (ref 3.87–5.11)
RDW: 17.1 % — ABNORMAL HIGH (ref 11.5–15.5)
RDW: 17.2 % — ABNORMAL HIGH (ref 11.5–15.5)
WBC: 17.7 10*3/uL — ABNORMAL HIGH (ref 4.0–10.5)
WBC: 19.8 10*3/uL — ABNORMAL HIGH (ref 4.0–10.5)

## 2017-08-20 LAB — URINALYSIS, ROUTINE W REFLEX MICROSCOPIC
BILIRUBIN URINE: NEGATIVE
Bacteria, UA: NONE SEEN
GLUCOSE, UA: NEGATIVE mg/dL
KETONES UR: NEGATIVE mg/dL
LEUKOCYTES UA: NEGATIVE
Nitrite: NEGATIVE
PH: 8 (ref 5.0–8.0)
Protein, ur: NEGATIVE mg/dL
Specific Gravity, Urine: 1.013 (ref 1.005–1.030)

## 2017-08-20 LAB — BASIC METABOLIC PANEL
Anion gap: 10 (ref 5–15)
BUN: 42 mg/dL — ABNORMAL HIGH (ref 8–23)
CALCIUM: 9.2 mg/dL (ref 8.9–10.3)
CO2: 31 mmol/L (ref 22–32)
CREATININE: 0.55 mg/dL (ref 0.44–1.00)
Chloride: 93 mmol/L — ABNORMAL LOW (ref 98–111)
GLUCOSE: 120 mg/dL — AB (ref 70–99)
Potassium: 4 mmol/L (ref 3.5–5.1)
Sodium: 134 mmol/L — ABNORMAL LOW (ref 135–145)

## 2017-08-20 LAB — OCCULT BLOOD X 1 CARD TO LAB, STOOL: FECAL OCCULT BLD: POSITIVE — AB

## 2017-08-20 LAB — MAGNESIUM: Magnesium: 2 mg/dL (ref 1.7–2.4)

## 2017-08-20 NOTE — Progress Notes (Signed)
Pulmonary Critical Care [facility] [place]   PULMONARY SERVICE  PROGRESS NOTE  Date of Service: 08/20/2017  Tiffany NailerDeborah Parks Hosea  ZOX:[id number]  DOB: 03/19/1950   DOA: 08/02/2017  Referring Physician: Carron CurieAli Hijazi, MD  HPI: Tiffany Gordon is a 67 y.o. female seen for follow up of Acute on Chronic Respiratory Failure.  She is doing well weaning has been on 28% oxygen.  Last x-ray showed some interstitial changes she likely has some underlying fibrosis as noted  Medications: Reviewed on Rounds  Physical Exam:  Vitals: Temperature 97.6 pulse 79 respiratory rate 30 blood pressure 116/67 saturations 96%  Ventilator Settings currently off of the ventilator on T collar  . General: Comfortable at this time . Eyes: Grossly normal lids, irises & conjunctiva . ENT: grossly tongue is normal . Neck: no obvious mass . Cardiovascular: S1 S2 normal no gallop . Respiratory: No rhonchi . Abdomen: soft . Skin: no rash seen on limited exam . Musculoskeletal: not rigid . Psychiatric:unable to assess . Neurologic: no seizure no involuntary movements         Lab Data:   Basic Metabolic Panel: Recent Labs  Lab 08/14/17 0557 08/15/17 0702 08/16/17 0713 08/17/17 0607 08/20/17 0653  NA  --  141 140 135 134*  K 3.4* 3.5 3.1* 3.8 4.0  CL  --  96* 96* 94* 93*  CO2  --  34* 31 34* 31  GLUCOSE  --  90 82 101* 120*  BUN  --  21 12 11  42*  CREATININE  --  0.51 0.43* 0.57 0.55  CALCIUM  --  9.0 8.9 9.1 9.2  MG  --  1.7 2.1  --  2.0    Liver Function Tests: No results for input(s): AST, ALT, ALKPHOS, BILITOT, PROT, ALBUMIN in the last 168 hours. No results for input(s): LIPASE, AMYLASE in the last 168 hours. No results for input(s): AMMONIA in the last 168 hours.  CBC: Recent Labs  Lab 08/14/17 0628 08/15/17 0702 08/17/17 0607 08/20/17 0653  WBC 14.8* 14.7* 12.6* 17.7*  HGB 7.9* 7.9* 8.1* 7.4*  HCT 27.0* 27.6* 28.0* 26.1*  MCV 94.4 94.5 93.0 94.6  PLT 382  403* 401* 383    Cardiac Enzymes: No results for input(s): CKTOTAL, CKMB, CKMBINDEX, TROPONINI in the last 168 hours.  BNP (last 3 results) Recent Labs    08/05/17 1004  BNP 940.5*    ProBNP (last 3 results) No results for input(s): PROBNP in the last 8760 hours.  Radiological Exams: Dg Chest Port 1 View  Result Date: 08/19/2017 CLINICAL DATA:  Respiratory failure EXAM: PORTABLE CHEST 1 VIEW COMPARISON:  August 13, 2017 FINDINGS: Tracheostomy catheter tip is 5.6 cm above the carina. There is an apparent stent overlying the level of the esophagus in the midline. No pneumothorax. There are small pleural effusions bilaterally. There is diffuse interstitial thickening bilaterally, likely with parenchymal fibrotic change. There is no consolidation. Heart is upper normal in size with pulmonary vascularity normal. There is aortic atherosclerosis. Bones are osteoporotic. IMPRESSION: Tube positions as described without pneumothorax. Small pleural effusions bilaterally with pulmonary fibrotic change throughout the lungs. No consolidation. Stable cardiac silhouette. There is aortic atherosclerosis. Bones are osteoporotic. Aortic Atherosclerosis (ICD10-I70.0). Electronically Signed   By: Bretta BangWilliam  Woodruff III M.D.   On: 08/19/2017 07:07    Assessment/Plan Active Problems:   Acute on chronic respiratory failure with hypoxia (HCC)   Severe sepsis with septic shock (HCC)   Healthcare-associated pneumonia   Acute metabolic encephalopathy  Alcohol withdrawal seizure with complication, with unspecified complication (HCC)   Severe mitral valve regurgitation   COPD (chronic obstructive pulmonary disease) (HCC)   1. Acute on chronic respiratory failure with hypoxia we will continue with full support on weaning T collar.  Doing well.  Patient is currently placed on T collar as tolerated.  Requiring 28% oxygen with good saturations. 2. Severe sepsis with shock hemodynamically stable 3. Interstitial  pneumonitis I have ordered a CT scan of the chest to further evaluate 4. Healthcare associated pneumonia treated 5. COPD severe disease naps 6. Alcohol withdrawal stable no active seizures noted   I have personally seen and evaluated the patient, evaluated laboratory and imaging results, formulated the assessment and plan and placed orders. The Patient requires high complexity decision making for assessment and support.  Case was discussed on Rounds with the Respiratory Therapy Staff  Yevonne Pax, MD [facility] Pulmonary Critical Care Medicine Sleep Medicine

## 2017-08-21 ENCOUNTER — Other Ambulatory Visit (HOSPITAL_COMMUNITY): Payer: Medicare Other

## 2017-08-21 DIAGNOSIS — J449 Chronic obstructive pulmonary disease, unspecified: Secondary | ICD-10-CM | POA: Diagnosis not present

## 2017-08-21 DIAGNOSIS — G9341 Metabolic encephalopathy: Secondary | ICD-10-CM | POA: Diagnosis not present

## 2017-08-21 DIAGNOSIS — F10239 Alcohol dependence with withdrawal, unspecified: Secondary | ICD-10-CM | POA: Diagnosis not present

## 2017-08-21 DIAGNOSIS — J9621 Acute and chronic respiratory failure with hypoxia: Secondary | ICD-10-CM | POA: Diagnosis not present

## 2017-08-21 LAB — BASIC METABOLIC PANEL
Anion gap: 13 (ref 5–15)
BUN: 51 mg/dL — AB (ref 8–23)
CHLORIDE: 93 mmol/L — AB (ref 98–111)
CO2: 29 mmol/L (ref 22–32)
Calcium: 9.3 mg/dL (ref 8.9–10.3)
Creatinine, Ser: 0.51 mg/dL (ref 0.44–1.00)
Glucose, Bld: 93 mg/dL (ref 70–99)
Potassium: 4.7 mmol/L (ref 3.5–5.1)
SODIUM: 135 mmol/L (ref 135–145)

## 2017-08-21 LAB — CBC
HCT: 26.9 % — ABNORMAL LOW (ref 36.0–46.0)
HEMOGLOBIN: 8.3 g/dL — AB (ref 12.0–15.0)
MCH: 27.7 pg (ref 26.0–34.0)
MCHC: 30.9 g/dL (ref 30.0–36.0)
MCV: 89.7 fL (ref 78.0–100.0)
PLATELETS: 403 10*3/uL — AB (ref 150–400)
RBC: 3 MIL/uL — AB (ref 3.87–5.11)
RDW: 17.3 % — ABNORMAL HIGH (ref 11.5–15.5)
WBC: 20.4 10*3/uL — ABNORMAL HIGH (ref 4.0–10.5)

## 2017-08-21 LAB — MAGNESIUM: MAGNESIUM: 2 mg/dL (ref 1.7–2.4)

## 2017-08-21 LAB — URINE CULTURE: CULTURE: NO GROWTH

## 2017-08-21 LAB — C DIFFICILE QUICK SCREEN W PCR REFLEX
C DIFFICILE (CDIFF) TOXIN: NEGATIVE
C Diff antigen: NEGATIVE
C Diff interpretation: NOT DETECTED

## 2017-08-21 LAB — PHOSPHORUS: Phosphorus: 5.6 mg/dL — ABNORMAL HIGH (ref 2.5–4.6)

## 2017-08-21 NOTE — Progress Notes (Signed)
Pulmonary Critical Care Medicine [facility] [place]   PULMONARY SERVICE  PROGRESS NOTE  Date of Service: 08/21/2017  Tiffany Gordon  UJW:[id number]  DOB: [birth year]   DOA: 08/02/2017  Referring Physician: Carron Curie, MD  HPI: Tiffany Gordon is a 67 y.o. female seen for follow up of Acute on Chronic Respiratory Failure.  Remains on T collar has been off the ventilator for more than 24 hours doing well she is been using the PMV tolerating it  Medications: Reviewed on Rounds  Physical Exam:  Vitals: Temperature 97.4 pulse 74 respiratory 28 blood pressure 108/63 saturations 97%  Ventilator Settings currently off the ventilator  . General: Comfortable at this time . Eyes: Grossly normal lids, irises & conjunctiva . ENT: grossly tongue is normal . Neck: no obvious mass . Cardiovascular: S1 S2 normal no gallop . Respiratory: No rhonchi or rales . Abdomen: soft . Skin: no rash seen on limited exam . Musculoskeletal: not rigid . Psychiatric:unable to assess . Neurologic: no seizure no involuntary movements         Lab Data:   Basic Metabolic Panel: Recent Labs  Lab 08/15/17 0702 08/16/17 0713 08/17/17 0607 08/20/17 0653 08/21/17 0710  NA 141 140 135 134* 135  K 3.5 3.1* 3.8 4.0 4.7  CL 96* 96* 94* 93* 93*  CO2 34* 31 34* 31 29  GLUCOSE 90 82 101* 120* 93  BUN 21 12 11  42* 51*  CREATININE 0.51 0.43* 0.57 0.55 0.51  CALCIUM 9.0 8.9 9.1 9.2 9.3  MG 1.7 2.1  --  2.0 2.0  PHOS  --   --   --   --  5.6*    Liver Function Tests: No results for input(s): AST, ALT, ALKPHOS, BILITOT, PROT, ALBUMIN in the last 168 hours. No results for input(s): LIPASE, AMYLASE in the last 168 hours. No results for input(s): AMMONIA in the last 168 hours.  CBC: Recent Labs  Lab 08/15/17 0702 08/17/17 0607 08/20/17 0653 08/20/17 1643 08/21/17 0710  WBC 14.7* 12.6* 17.7* 19.8* 20.4*  HGB 7.9* 8.1* 7.4* 8.1* 8.3*  HCT 27.6* 28.0* 26.1* 28.3* 26.9*  MCV 94.5 93.0  94.6 94.0 89.7  PLT 403* 401* 383 262 403*    Cardiac Enzymes: No results for input(s): CKTOTAL, CKMB, CKMBINDEX, TROPONINI in the last 168 hours.  BNP (last 3 results) Recent Labs    08/05/17 1004  BNP 940.5*    ProBNP (last 3 results) No results for input(s): PROBNP in the last 8760 hours.  Radiological Exams: Ct Abdomen Pelvis Wo Contrast  Result Date: 08/21/2017 CLINICAL DATA:  67 year old female with history of pulmonary fibrosis. Evaluate for gastrointestinal or retroperitoneal bleed. EXAM: CT CHEST, ABDOMEN AND PELVIS WITHOUT CONTRAST TECHNIQUE: Multidetector CT imaging of the chest, abdomen and pelvis was performed following the standard protocol without IV contrast. COMPARISON:  CT the abdomen and pelvis 08/12/2017. FINDINGS: CT CHEST FINDINGS Cardiovascular: Heart size is mildly enlarged. There is no significant pericardial fluid, thickening or pericardial calcification. There is aortic atherosclerosis, as well as atherosclerosis of the great vessels of the mediastinum and the coronary arteries, including calcified atherosclerotic plaque in the left main, left anterior descending, left circumflex and right coronary arteries. Mediastinum/Nodes: Tracheostomy tube in position with tip terminating approximately 3.5 cm above the carina. No pathologically enlarged mediastinal or hilar lymph nodes. Please note that accurate exclusion of hilar adenopathy is limited on noncontrast CT scans. Esophagus is unremarkable in appearance. No axillary lymphadenopathy. Lungs/Pleura: Patchy multifocal areas of ground-glass attenuation,  airspace consolidation and regional areas of architectural distortion are noted throughout the lungs bilaterally. These findings have a mild craniocaudal gradient. Some subpleural reticulation is noted, most evident in the periphery of the lower lungs. Scattered areas of mild cylindrical bronchiectasis. No definite honeycombing confidently identified at this time.  High-resolution imaging was not performed. Small left pleural effusion. Trace right pleural effusion. Musculoskeletal: There are no aggressive appearing lytic or blastic lesions noted in the visualized portions of the skeleton. CT ABDOMEN PELVIS FINDINGS Hepatobiliary: No definite cystic or solid hepatic lesions are confidently identified on today's noncontrast CT examination. Small partially calcified gallstones lying dependently in the gallbladder. No findings to suggest an acute cholecystitis are noted at this time. Pancreas: Extending off the inferior aspect of the head and uncinate process of the pancreas there is an ill-defined 2.4 x 3.5 x 3.7 cm low-attenuation area (axial image 72 of series 3 and coronal image 30 of series 5) which may represent a pancreatic mass or peripancreatic fluid collections such as a pseudocyst. This is poorly evaluated on today's noncontrast CT examination. Spleen: Unremarkable. Adrenals/Urinary Tract: Unenhanced appearance of the kidneys and bilateral adrenal glands is normal. No hydroureteronephrosis. Urinary bladder is moderately distended, but otherwise unremarkable in appearance. Stomach/Bowel: Percutaneous gastrostomy tube is noted with retention balloon in place in the distal body of the stomach. Unenhanced appearance of the stomach is otherwise unremarkable. No pathologic dilatation of small bowel or colon. A few scattered colonic diverticulae are noted, without surrounding inflammatory changes to suggest an acute diverticulitis at this time. The appendix is not confidently identified and may be surgically absent. Regardless, there are no inflammatory changes noted adjacent to the cecum to suggest the presence of an acute appendicitis at this time. Rectal bag in place. Vascular/Lymphatic: Aortic atherosclerosis. No definite lymphadenopathy identified in the abdomen or pelvis on today's noncontrast CT examination. Reproductive: Uterus and ovaries are atrophic. Other: No  significant volume of ascites. No pneumoperitoneum. No high attenuation fluid collection in the peritoneal cavity or retroperitoneum to indicate site of occult hemorrhage. Musculoskeletal: No aggressive appearing lytic or blastic lesions are noted in the visualized portions of the skeleton. IMPRESSION: 1. No definite occult intraperitoneal or retroperitoneal hemorrhage identified. 2. Poorly defined low-attenuation mass like area extending caudally from the inferior aspect of the head and uncinate process of the pancreas. Whether this represents a pancreatic mass or peripancreatic fluid collection is uncertain. This could be better evaluated with followup MRI of the abdomen with and without IV gadolinium with MRCP. However, if the patient is not able to breath hold for a MRI examination, pancreatic protocol CT scan should be utilized as it is less sensitive to motion. If iodinated contrast material is not possible due to renal insufficiency, further evaluation with endoscopy and endoscopic ultrasound should be considered. 3. Unusual appearance of the lungs. High-resolution imaging was not performed, however, in some imaging findings may suggest underlying interstitial lung disease. There is also likely multilobar bronchopneumonia superimposed upon this chronic process. Further evaluation with nonemergent high-resolution chest CT should be considered in the next 6-12 months to assess for temporal changes in the appearance of the lung parenchyma. 4. Aortic atherosclerosis, in addition to left main and 3 vessel coronary artery disease. Please note that although the presence of coronary artery calcium documents the presence of coronary artery disease, the severity of this disease and any potential stenosis cannot be assessed on this non-gated CT examination. Assessment for potential risk factor modification, dietary therapy or pharmacologic therapy may be  warranted, if clinically indicated. 5. Cardiomegaly. 6. Additional  incidental findings, as above. Aortic Atherosclerosis (ICD10-I70.0). Electronically Signed   By: Trudie Reed M.D.   On: 08/21/2017 08:34   Ct Chest Wo Contrast  Result Date: 08/21/2017 CLINICAL DATA:  67 year old female with history of pulmonary fibrosis. Evaluate for gastrointestinal or retroperitoneal bleed. EXAM: CT CHEST, ABDOMEN AND PELVIS WITHOUT CONTRAST TECHNIQUE: Multidetector CT imaging of the chest, abdomen and pelvis was performed following the standard protocol without IV contrast. COMPARISON:  CT the abdomen and pelvis 08/12/2017. FINDINGS: CT CHEST FINDINGS Cardiovascular: Heart size is mildly enlarged. There is no significant pericardial fluid, thickening or pericardial calcification. There is aortic atherosclerosis, as well as atherosclerosis of the great vessels of the mediastinum and the coronary arteries, including calcified atherosclerotic plaque in the left main, left anterior descending, left circumflex and right coronary arteries. Mediastinum/Nodes: Tracheostomy tube in position with tip terminating approximately 3.5 cm above the carina. No pathologically enlarged mediastinal or hilar lymph nodes. Please note that accurate exclusion of hilar adenopathy is limited on noncontrast CT scans. Esophagus is unremarkable in appearance. No axillary lymphadenopathy. Lungs/Pleura: Patchy multifocal areas of ground-glass attenuation, airspace consolidation and regional areas of architectural distortion are noted throughout the lungs bilaterally. These findings have a mild craniocaudal gradient. Some subpleural reticulation is noted, most evident in the periphery of the lower lungs. Scattered areas of mild cylindrical bronchiectasis. No definite honeycombing confidently identified at this time. High-resolution imaging was not performed. Small left pleural effusion. Trace right pleural effusion. Musculoskeletal: There are no aggressive appearing lytic or blastic lesions noted in the visualized  portions of the skeleton. CT ABDOMEN PELVIS FINDINGS Hepatobiliary: No definite cystic or solid hepatic lesions are confidently identified on today's noncontrast CT examination. Small partially calcified gallstones lying dependently in the gallbladder. No findings to suggest an acute cholecystitis are noted at this time. Pancreas: Extending off the inferior aspect of the head and uncinate process of the pancreas there is an ill-defined 2.4 x 3.5 x 3.7 cm low-attenuation area (axial image 72 of series 3 and coronal image 30 of series 5) which may represent a pancreatic mass or peripancreatic fluid collections such as a pseudocyst. This is poorly evaluated on today's noncontrast CT examination. Spleen: Unremarkable. Adrenals/Urinary Tract: Unenhanced appearance of the kidneys and bilateral adrenal glands is normal. No hydroureteronephrosis. Urinary bladder is moderately distended, but otherwise unremarkable in appearance. Stomach/Bowel: Percutaneous gastrostomy tube is noted with retention balloon in place in the distal body of the stomach. Unenhanced appearance of the stomach is otherwise unremarkable. No pathologic dilatation of small bowel or colon. A few scattered colonic diverticulae are noted, without surrounding inflammatory changes to suggest an acute diverticulitis at this time. The appendix is not confidently identified and may be surgically absent. Regardless, there are no inflammatory changes noted adjacent to the cecum to suggest the presence of an acute appendicitis at this time. Rectal bag in place. Vascular/Lymphatic: Aortic atherosclerosis. No definite lymphadenopathy identified in the abdomen or pelvis on today's noncontrast CT examination. Reproductive: Uterus and ovaries are atrophic. Other: No significant volume of ascites. No pneumoperitoneum. No high attenuation fluid collection in the peritoneal cavity or retroperitoneum to indicate site of occult hemorrhage. Musculoskeletal: No aggressive  appearing lytic or blastic lesions are noted in the visualized portions of the skeleton. IMPRESSION: 1. No definite occult intraperitoneal or retroperitoneal hemorrhage identified. 2. Poorly defined low-attenuation mass like area extending caudally from the inferior aspect of the head and uncinate process of the pancreas. Whether this  represents a pancreatic mass or peripancreatic fluid collection is uncertain. This could be better evaluated with followup MRI of the abdomen with and without IV gadolinium with MRCP. However, if the patient is not able to breath hold for a MRI examination, pancreatic protocol CT scan should be utilized as it is less sensitive to motion. If iodinated contrast material is not possible due to renal insufficiency, further evaluation with endoscopy and endoscopic ultrasound should be considered. 3. Unusual appearance of the lungs. High-resolution imaging was not performed, however, in some imaging findings may suggest underlying interstitial lung disease. There is also likely multilobar bronchopneumonia superimposed upon this chronic process. Further evaluation with nonemergent high-resolution chest CT should be considered in the next 6-12 months to assess for temporal changes in the appearance of the lung parenchyma. 4. Aortic atherosclerosis, in addition to left main and 3 vessel coronary artery disease. Please note that although the presence of coronary artery calcium documents the presence of coronary artery disease, the severity of this disease and any potential stenosis cannot be assessed on this non-gated CT examination. Assessment for potential risk factor modification, dietary therapy or pharmacologic therapy may be warranted, if clinically indicated. 5. Cardiomegaly. 6. Additional incidental findings, as above. Aortic Atherosclerosis (ICD10-I70.0). Electronically Signed   By: Trudie Reedaniel  Entrikin M.D.   On: 08/21/2017 08:34    Assessment/Plan Active Problems:   Acute on chronic  respiratory failure with hypoxia (HCC)   Severe sepsis with septic shock (HCC)   Healthcare-associated pneumonia   Acute metabolic encephalopathy   Alcohol withdrawal seizure with complication, with unspecified complication (HCC)   Severe mitral valve regurgitation   COPD (chronic obstructive pulmonary disease) (HCC)   1. Acute on chronic respiratory failure with hypoxia we will continue with T collar weaning as ordered.  Continue secretion management pulmonary toilet. 2. Severe sepsis with shock hemodynamically stable 3. Alcohol withdrawal no active withdrawal 4. Acute metabolic encephalopathy seems to be clearing 5. COPD severe disease we will follow 6. Healthcare associated pneumonia treated we will continue with present management   I have personally seen and evaluated the patient, evaluated laboratory and imaging results, formulated the assessment and plan and placed orders. The Patient requires high complexity decision making for assessment and support.  Case was discussed on Rounds with the Respiratory Therapy Staff  Yevonne PaxSaadat A Khan, MD [facility] HospitalFCCP Pulmonary Critical Care Medicine Sleep Medicine

## 2017-08-22 DIAGNOSIS — J849 Interstitial pulmonary disease, unspecified: Secondary | ICD-10-CM

## 2017-08-22 DIAGNOSIS — J449 Chronic obstructive pulmonary disease, unspecified: Secondary | ICD-10-CM | POA: Diagnosis not present

## 2017-08-22 DIAGNOSIS — A419 Sepsis, unspecified organism: Secondary | ICD-10-CM | POA: Diagnosis not present

## 2017-08-22 DIAGNOSIS — R569 Unspecified convulsions: Secondary | ICD-10-CM | POA: Diagnosis not present

## 2017-08-22 DIAGNOSIS — I340 Nonrheumatic mitral (valve) insufficiency: Secondary | ICD-10-CM | POA: Diagnosis not present

## 2017-08-22 DIAGNOSIS — F10239 Alcohol dependence with withdrawal, unspecified: Secondary | ICD-10-CM | POA: Diagnosis not present

## 2017-08-22 DIAGNOSIS — J189 Pneumonia, unspecified organism: Secondary | ICD-10-CM | POA: Diagnosis not present

## 2017-08-22 DIAGNOSIS — J9621 Acute and chronic respiratory failure with hypoxia: Secondary | ICD-10-CM | POA: Diagnosis not present

## 2017-08-22 DIAGNOSIS — G9341 Metabolic encephalopathy: Secondary | ICD-10-CM | POA: Diagnosis not present

## 2017-08-22 DIAGNOSIS — R6521 Severe sepsis with septic shock: Secondary | ICD-10-CM | POA: Diagnosis not present

## 2017-08-22 LAB — CULTURE, RESPIRATORY W GRAM STAIN

## 2017-08-22 LAB — BASIC METABOLIC PANEL
Anion gap: 11 (ref 5–15)
BUN: 47 mg/dL — AB (ref 8–23)
CALCIUM: 9.6 mg/dL (ref 8.9–10.3)
CO2: 33 mmol/L — AB (ref 22–32)
Chloride: 94 mmol/L — ABNORMAL LOW (ref 98–111)
Creatinine, Ser: 0.53 mg/dL (ref 0.44–1.00)
GFR calc Af Amer: >60 mL/min (ref >60)
Glucose, Bld: 83 mg/dL (ref 70–99)
POTASSIUM: 4.3 mmol/L (ref 3.5–5.1)
Sodium: 138 mmol/L (ref 135–145)

## 2017-08-22 LAB — CBC
HEMATOCRIT: 27.1 % — AB (ref 36.0–46.0)
Hemoglobin: 7.7 g/dL — ABNORMAL LOW (ref 12.0–15.0)
MCH: 26.8 pg (ref 26.0–34.0)
MCHC: 28.4 g/dL — ABNORMAL LOW (ref 30.0–36.0)
MCV: 94.4 fL (ref 78.0–100.0)
PLATELETS: 422 10*3/uL — AB (ref 150–400)
RBC: 2.87 MIL/uL — ABNORMAL LOW (ref 3.87–5.11)
RDW: 18 % — AB (ref 11.5–15.5)
WBC: 16.5 10*3/uL — ABNORMAL HIGH (ref 4.0–10.5)

## 2017-08-22 LAB — PHOSPHORUS: Phosphorus: 5.6 mg/dL — ABNORMAL HIGH (ref 2.5–4.6)

## 2017-08-22 LAB — CULTURE, RESPIRATORY

## 2017-08-22 LAB — MAGNESIUM: Magnesium: 2 mg/dL (ref 1.7–2.4)

## 2017-08-22 NOTE — Progress Notes (Signed)
Pulmonary Critical Care Medicine [facility] [place]   PULMONARY SERVICE  PROGRESS NOTE  Date of Service: 08/22/2017  Tiffany Gordon  UEA:[id number]  DOB: May 09, 1950   DOA: 08/02/2017  Referring Physician: Carron Curie, MD  HPI: Tiffany Gordon is a 67 y.o. female seen for follow up of Acute on Chronic Respiratory Failure.  Patient is doing fine with T collar.  Has been on 28% oxygen.  Is also been tolerating the PMV.  Right now is off the ventilator for more than 48 hours.  CT scan of the chest was done which does show some interstitial lung disease she probably has some areas of honeycombing though the scan was not high-resolution it is discernible.  She does also areas of bronchiectasis.  Medications: Reviewed on Rounds  Physical Exam:  Vitals: Temperature 96.4 pulse 82 respiratory rate 38 blood pressure 119/58 saturations 93%  Ventilator Settings off of the ventilator on T collar at this time  . General: Comfortable at this time . Eyes: Grossly normal lids, irises & conjunctiva . ENT: grossly tongue is normal . Neck: no obvious mass . Cardiovascular: S1 S2 normal no gallop . Respiratory: Scattered rhonchi are noted bilaterally . Abdomen: soft . Skin: no rash seen on limited exam . Musculoskeletal: not rigid . Psychiatric:unable to assess . Neurologic: no seizure no involuntary movements         Lab Data:   Basic Metabolic Panel: Recent Labs  Lab 08/16/17 0713 08/17/17 0607 08/20/17 0653 08/21/17 0710 08/22/17 0702  NA 140 135 134* 135 138  K 3.1* 3.8 4.0 4.7 4.3  CL 96* 94* 93* 93* 94*  CO2 31 34* 31 29 33*  GLUCOSE 82 101* 120* 93 83  BUN 12 11 42* 51* 47*  CREATININE 0.43* 0.57 0.55 0.51 0.53  CALCIUM 8.9 9.1 9.2 9.3 9.6  MG 2.1  --  2.0 2.0 2.0  PHOS  --   --   --  5.6* 5.6*    Liver Function Tests: No results for input(s): AST, ALT, ALKPHOS, BILITOT, PROT, ALBUMIN in the last 168 hours. No results for input(s): LIPASE, AMYLASE in  the last 168 hours. No results for input(s): AMMONIA in the last 168 hours.  CBC: Recent Labs  Lab 08/17/17 0607 08/20/17 0653 08/20/17 1643 08/21/17 0710 08/22/17 0702  WBC 12.6* 17.7* 19.8* 20.4* 16.5*  HGB 8.1* 7.4* 8.1* 8.3* 7.7*  HCT 28.0* 26.1* 28.3* 26.9* 27.1*  MCV 93.0 94.6 94.0 89.7 94.4  PLT 401* 383 262 403* 422*    Cardiac Enzymes: No results for input(s): CKTOTAL, CKMB, CKMBINDEX, TROPONINI in the last 168 hours.  BNP (last 3 results) Recent Labs    08/05/17 1004  BNP 940.5*    ProBNP (last 3 results) No results for input(s): PROBNP in the last 8760 hours.  Radiological Exams: Ct Abdomen Pelvis Wo Contrast  Result Date: 08/21/2017 CLINICAL DATA:  67 year old female with history of pulmonary fibrosis. Evaluate for gastrointestinal or retroperitoneal bleed. EXAM: CT CHEST, ABDOMEN AND PELVIS WITHOUT CONTRAST TECHNIQUE: Multidetector CT imaging of the chest, abdomen and pelvis was performed following the standard protocol without IV contrast. COMPARISON:  CT the abdomen and pelvis 08/12/2017. FINDINGS: CT CHEST FINDINGS Cardiovascular: Heart size is mildly enlarged. There is no significant pericardial fluid, thickening or pericardial calcification. There is aortic atherosclerosis, as well as atherosclerosis of the great vessels of the mediastinum and the coronary arteries, including calcified atherosclerotic plaque in the left main, left anterior descending, left circumflex and right coronary  arteries. Mediastinum/Nodes: Tracheostomy tube in position with tip terminating approximately 3.5 cm above the carina. No pathologically enlarged mediastinal or hilar lymph nodes. Please note that accurate exclusion of hilar adenopathy is limited on noncontrast CT scans. Esophagus is unremarkable in appearance. No axillary lymphadenopathy. Lungs/Pleura: Patchy multifocal areas of ground-glass attenuation, airspace consolidation and regional areas of architectural distortion are noted  throughout the lungs bilaterally. These findings have a mild craniocaudal gradient. Some subpleural reticulation is noted, most evident in the periphery of the lower lungs. Scattered areas of mild cylindrical bronchiectasis. No definite honeycombing confidently identified at this time. High-resolution imaging was not performed. Small left pleural effusion. Trace right pleural effusion. Musculoskeletal: There are no aggressive appearing lytic or blastic lesions noted in the visualized portions of the skeleton. CT ABDOMEN PELVIS FINDINGS Hepatobiliary: No definite cystic or solid hepatic lesions are confidently identified on today's noncontrast CT examination. Small partially calcified gallstones lying dependently in the gallbladder. No findings to suggest an acute cholecystitis are noted at this time. Pancreas: Extending off the inferior aspect of the head and uncinate process of the pancreas there is an ill-defined 2.4 x 3.5 x 3.7 cm low-attenuation area (axial image 72 of series 3 and coronal image 30 of series 5) which may represent a pancreatic mass or peripancreatic fluid collections such as a pseudocyst. This is poorly evaluated on today's noncontrast CT examination. Spleen: Unremarkable. Adrenals/Urinary Tract: Unenhanced appearance of the kidneys and bilateral adrenal glands is normal. No hydroureteronephrosis. Urinary bladder is moderately distended, but otherwise unremarkable in appearance. Stomach/Bowel: Percutaneous gastrostomy tube is noted with retention balloon in place in the distal body of the stomach. Unenhanced appearance of the stomach is otherwise unremarkable. No pathologic dilatation of small bowel or colon. A few scattered colonic diverticulae are noted, without surrounding inflammatory changes to suggest an acute diverticulitis at this time. The appendix is not confidently identified and may be surgically absent. Regardless, there are no inflammatory changes noted adjacent to the cecum to  suggest the presence of an acute appendicitis at this time. Rectal bag in place. Vascular/Lymphatic: Aortic atherosclerosis. No definite lymphadenopathy identified in the abdomen or pelvis on today's noncontrast CT examination. Reproductive: Uterus and ovaries are atrophic. Other: No significant volume of ascites. No pneumoperitoneum. No high attenuation fluid collection in the peritoneal cavity or retroperitoneum to indicate site of occult hemorrhage. Musculoskeletal: No aggressive appearing lytic or blastic lesions are noted in the visualized portions of the skeleton. IMPRESSION: 1. No definite occult intraperitoneal or retroperitoneal hemorrhage identified. 2. Poorly defined low-attenuation mass like area extending caudally from the inferior aspect of the head and uncinate process of the pancreas. Whether this represents a pancreatic mass or peripancreatic fluid collection is uncertain. This could be better evaluated with followup MRI of the abdomen with and without IV gadolinium with MRCP. However, if the patient is not able to breath hold for a MRI examination, pancreatic protocol CT scan should be utilized as it is less sensitive to motion. If iodinated contrast material is not possible due to renal insufficiency, further evaluation with endoscopy and endoscopic ultrasound should be considered. 3. Unusual appearance of the lungs. High-resolution imaging was not performed, however, in some imaging findings may suggest underlying interstitial lung disease. There is also likely multilobar bronchopneumonia superimposed upon this chronic process. Further evaluation with nonemergent high-resolution chest CT should be considered in the next 6-12 months to assess for temporal changes in the appearance of the lung parenchyma. 4. Aortic atherosclerosis, in addition to left main and  3 vessel coronary artery disease. Please note that although the presence of coronary artery calcium documents the presence of coronary  artery disease, the severity of this disease and any potential stenosis cannot be assessed on this non-gated CT examination. Assessment for potential risk factor modification, dietary therapy or pharmacologic therapy may be warranted, if clinically indicated. 5. Cardiomegaly. 6. Additional incidental findings, as above. Aortic Atherosclerosis (ICD10-I70.0). Electronically Signed   By: Trudie Reed M.D.   On: 08/21/2017 08:34   Ct Chest Wo Contrast  Result Date: 08/21/2017 CLINICAL DATA:  67 year old female with history of pulmonary fibrosis. Evaluate for gastrointestinal or retroperitoneal bleed. EXAM: CT CHEST, ABDOMEN AND PELVIS WITHOUT CONTRAST TECHNIQUE: Multidetector CT imaging of the chest, abdomen and pelvis was performed following the standard protocol without IV contrast. COMPARISON:  CT the abdomen and pelvis 08/12/2017. FINDINGS: CT CHEST FINDINGS Cardiovascular: Heart size is mildly enlarged. There is no significant pericardial fluid, thickening or pericardial calcification. There is aortic atherosclerosis, as well as atherosclerosis of the great vessels of the mediastinum and the coronary arteries, including calcified atherosclerotic plaque in the left main, left anterior descending, left circumflex and right coronary arteries. Mediastinum/Nodes: Tracheostomy tube in position with tip terminating approximately 3.5 cm above the carina. No pathologically enlarged mediastinal or hilar lymph nodes. Please note that accurate exclusion of hilar adenopathy is limited on noncontrast CT scans. Esophagus is unremarkable in appearance. No axillary lymphadenopathy. Lungs/Pleura: Patchy multifocal areas of ground-glass attenuation, airspace consolidation and regional areas of architectural distortion are noted throughout the lungs bilaterally. These findings have a mild craniocaudal gradient. Some subpleural reticulation is noted, most evident in the periphery of the lower lungs. Scattered areas of mild  cylindrical bronchiectasis. No definite honeycombing confidently identified at this time. High-resolution imaging was not performed. Small left pleural effusion. Trace right pleural effusion. Musculoskeletal: There are no aggressive appearing lytic or blastic lesions noted in the visualized portions of the skeleton. CT ABDOMEN PELVIS FINDINGS Hepatobiliary: No definite cystic or solid hepatic lesions are confidently identified on today's noncontrast CT examination. Small partially calcified gallstones lying dependently in the gallbladder. No findings to suggest an acute cholecystitis are noted at this time. Pancreas: Extending off the inferior aspect of the head and uncinate process of the pancreas there is an ill-defined 2.4 x 3.5 x 3.7 cm low-attenuation area (axial image 72 of series 3 and coronal image 30 of series 5) which may represent a pancreatic mass or peripancreatic fluid collections such as a pseudocyst. This is poorly evaluated on today's noncontrast CT examination. Spleen: Unremarkable. Adrenals/Urinary Tract: Unenhanced appearance of the kidneys and bilateral adrenal glands is normal. No hydroureteronephrosis. Urinary bladder is moderately distended, but otherwise unremarkable in appearance. Stomach/Bowel: Percutaneous gastrostomy tube is noted with retention balloon in place in the distal body of the stomach. Unenhanced appearance of the stomach is otherwise unremarkable. No pathologic dilatation of small bowel or colon. A few scattered colonic diverticulae are noted, without surrounding inflammatory changes to suggest an acute diverticulitis at this time. The appendix is not confidently identified and may be surgically absent. Regardless, there are no inflammatory changes noted adjacent to the cecum to suggest the presence of an acute appendicitis at this time. Rectal bag in place. Vascular/Lymphatic: Aortic atherosclerosis. No definite lymphadenopathy identified in the abdomen or pelvis on today's  noncontrast CT examination. Reproductive: Uterus and ovaries are atrophic. Other: No significant volume of ascites. No pneumoperitoneum. No high attenuation fluid collection in the peritoneal cavity or retroperitoneum to indicate site of occult  hemorrhage. Musculoskeletal: No aggressive appearing lytic or blastic lesions are noted in the visualized portions of the skeleton. IMPRESSION: 1. No definite occult intraperitoneal or retroperitoneal hemorrhage identified. 2. Poorly defined low-attenuation mass like area extending caudally from the inferior aspect of the head and uncinate process of the pancreas. Whether this represents a pancreatic mass or peripancreatic fluid collection is uncertain. This could be better evaluated with followup MRI of the abdomen with and without IV gadolinium with MRCP. However, if the patient is not able to breath hold for a MRI examination, pancreatic protocol CT scan should be utilized as it is less sensitive to motion. If iodinated contrast material is not possible due to renal insufficiency, further evaluation with endoscopy and endoscopic ultrasound should be considered. 3. Unusual appearance of the lungs. High-resolution imaging was not performed, however, in some imaging findings may suggest underlying interstitial lung disease. There is also likely multilobar bronchopneumonia superimposed upon this chronic process. Further evaluation with nonemergent high-resolution chest CT should be considered in the next 6-12 months to assess for temporal changes in the appearance of the lung parenchyma. 4. Aortic atherosclerosis, in addition to left main and 3 vessel coronary artery disease. Please note that although the presence of coronary artery calcium documents the presence of coronary artery disease, the severity of this disease and any potential stenosis cannot be assessed on this non-gated CT examination. Assessment for potential risk factor modification, dietary therapy or  pharmacologic therapy may be warranted, if clinically indicated. 5. Cardiomegaly. 6. Additional incidental findings, as above. Aortic Atherosclerosis (ICD10-I70.0). Electronically Signed   By: Trudie Reedaniel  Entrikin M.D.   On: 08/21/2017 08:34    Assessment/Plan Active Problems:   Acute on chronic respiratory failure with hypoxia (HCC)   Severe sepsis with septic shock (HCC)   Healthcare-associated pneumonia   Acute metabolic encephalopathy   Alcohol withdrawal seizure with complication, with unspecified complication (HCC)   Severe mitral valve regurgitation   COPD (chronic obstructive pulmonary disease) (HCC)   1. Acute on chronic respiratory failure with hypoxia patient will continue with weaning on T collar trials.  We will proceed to downsize her trach in the morning and hopefully try to begin capping trials. 2. Interstitial lung disease as noted on the CT scan.  She will need ongoing oxygen therapy probably will continue with supportive care she can follow-up as an outpatient. 3. Severe sepsis with shock hemodynamically stable we will continue with supportive care 4. Acute metabolic encephalopathy seems to be clearing up a little bit. 5. History of esophageal stricture this is found by history also on the CT scan was noted to have a dilated esophagus.  I would be hesitant to try to do any kind of swallowing studies on this patient as she would be probably high risk for aspiration. 6. History of alcohol abuse right now is stable we will continue with present management. 7. COPD continue supportive care 8. Severe mitral regurg stable   I have personally seen and evaluated the patient, evaluated laboratory and imaging results, formulated the assessment and plan and placed orders. The Patient requires high complexity decision making for assessment and support.  Case was discussed on Rounds with the Respiratory Therapy Staff time spent 35 minutes extended review of the CT scan and discussion on  rounds  Yevonne PaxSaadat A Amariyana Heacox, MD [facility] Pulmonary Critical Care Medicine Sleep Medicine

## 2017-08-23 ENCOUNTER — Other Ambulatory Visit (HOSPITAL_COMMUNITY): Payer: Medicare Other

## 2017-08-23 DIAGNOSIS — R569 Unspecified convulsions: Secondary | ICD-10-CM | POA: Diagnosis not present

## 2017-08-23 DIAGNOSIS — J90 Pleural effusion, not elsewhere classified: Secondary | ICD-10-CM | POA: Diagnosis not present

## 2017-08-23 DIAGNOSIS — J189 Pneumonia, unspecified organism: Secondary | ICD-10-CM | POA: Diagnosis not present

## 2017-08-23 DIAGNOSIS — I340 Nonrheumatic mitral (valve) insufficiency: Secondary | ICD-10-CM | POA: Diagnosis not present

## 2017-08-23 DIAGNOSIS — J849 Interstitial pulmonary disease, unspecified: Secondary | ICD-10-CM | POA: Diagnosis not present

## 2017-08-23 DIAGNOSIS — F10239 Alcohol dependence with withdrawal, unspecified: Secondary | ICD-10-CM | POA: Diagnosis not present

## 2017-08-23 DIAGNOSIS — R6521 Severe sepsis with septic shock: Secondary | ICD-10-CM | POA: Diagnosis not present

## 2017-08-23 DIAGNOSIS — G9341 Metabolic encephalopathy: Secondary | ICD-10-CM | POA: Diagnosis not present

## 2017-08-23 DIAGNOSIS — J9621 Acute and chronic respiratory failure with hypoxia: Secondary | ICD-10-CM | POA: Diagnosis not present

## 2017-08-23 DIAGNOSIS — J449 Chronic obstructive pulmonary disease, unspecified: Secondary | ICD-10-CM | POA: Diagnosis not present

## 2017-08-23 DIAGNOSIS — A419 Sepsis, unspecified organism: Secondary | ICD-10-CM | POA: Diagnosis not present

## 2017-08-23 LAB — BASIC METABOLIC PANEL
ANION GAP: 15 (ref 5–15)
BUN: 47 mg/dL — ABNORMAL HIGH (ref 8–23)
CALCIUM: 9.6 mg/dL (ref 8.9–10.3)
CO2: 33 mmol/L — AB (ref 22–32)
Chloride: 90 mmol/L — ABNORMAL LOW (ref 98–111)
Creatinine, Ser: 0.75 mg/dL (ref 0.44–1.00)
GFR calc non Af Amer: >60 mL/min (ref >60)
Glucose, Bld: 93 mg/dL (ref 70–99)
POTASSIUM: 3.8 mmol/L (ref 3.5–5.1)
Sodium: 138 mmol/L (ref 135–145)

## 2017-08-23 LAB — CBC
HCT: 26.1 % — ABNORMAL LOW (ref 36.0–46.0)
HEMOGLOBIN: 7.6 g/dL — AB (ref 12.0–15.0)
MCH: 27.4 pg (ref 26.0–34.0)
MCHC: 29.1 g/dL — ABNORMAL LOW (ref 30.0–36.0)
MCV: 94.2 fL (ref 78.0–100.0)
Platelets: 471 10*3/uL — ABNORMAL HIGH (ref 150–400)
RBC: 2.77 MIL/uL — AB (ref 3.87–5.11)
RDW: 18.1 % — AB (ref 11.5–15.5)
WBC: 15.4 10*3/uL — AB (ref 4.0–10.5)

## 2017-08-23 LAB — MAGNESIUM: Magnesium: 2.2 mg/dL (ref 1.7–2.4)

## 2017-08-23 MED ORDER — IOHEXOL 300 MG/ML  SOLN
100.0000 mL | Freq: Once | INTRAMUSCULAR | Status: AC | PRN
  Administered 2017-08-23: 100 mL via INTRAVENOUS

## 2017-08-23 NOTE — Progress Notes (Signed)
[facility] [place]   PULMONARY SERVICE  PROGRESS NOTE  Date of Service: 08/23/2017  Tiffany Gordon  WUJ:[id number]  DOB: 08/24/1950   DOA: 08/02/2017  Referring Physician: Carron CurieAli Hijazi, MD  HPI: Tiffany NailerDeborah Parks Gordon is a 67 y.o. female seen for follow up of Acute on Chronic Respiratory Failure.  She remains on the on T collar has been on 28% FiO2.  Has been off the ventilator for more than 48 hr.  Medications: Reviewed on Rounds  Physical Exam:  Vitals:   Temperature 98.0 degrees pulse 72 respiratory 24 blood pressure 132/57 saturations 96%  Ventilator Settings  Continuing on T collar  . General: Comfortable at this time . Eyes: Grossly normal lids, irises & conjunctiva . ENT: grossly tongue is normal . Neck: no obvious mass . Cardiovascular: S1 S2 normal no gallop . Respiratory:  No rhonchi or rales . Abdomen: soft . Skin: no rash seen on limited exam . Musculoskeletal: not rigid . Psychiatric:unable to assess . Neurologic: no seizure no involuntary movements         Lab Data:   Basic Metabolic Panel: Recent Labs  Lab 08/17/17 0607 08/20/17 0653 08/21/17 0710 08/22/17 0702 08/23/17 0625  NA 135 134* 135 138 138  K 3.8 4.0 4.7 4.3 3.8  CL 94* 93* 93* 94* 90*  CO2 34* 31 29 33* 33*  GLUCOSE 101* 120* 93 83 93  BUN 11 42* 51* 47* 47*  CREATININE 0.57 0.55 0.51 0.53 0.75  CALCIUM 9.1 9.2 9.3 9.6 9.6  MG  --  2.0 2.0 2.0 2.2  PHOS  --   --  5.6* 5.6*  --     Liver Function Tests: No results for input(s): AST, ALT, ALKPHOS, BILITOT, PROT, ALBUMIN in the last 168 hours. No results for input(s): LIPASE, AMYLASE in the last 168 hours. No results for input(s): AMMONIA in the last 168 hours.  CBC: Recent Labs  Lab 08/20/17 0653 08/20/17 1643 08/21/17 0710 08/22/17 0702 08/23/17 0625  WBC 17.7* 19.8* 20.4* 16.5* 15.4*  HGB 7.4* 8.1* 8.3* 7.7* 7.6*  HCT 26.1* 28.3* 26.9* 27.1* 26.1*  MCV 94.6 94.0 89.7 94.4  94.2  PLT 383 262 403* 422* 471*    Cardiac Enzymes: No results for input(s): CKTOTAL, CKMB, CKMBINDEX, TROPONINI in the last 168 hours.  BNP (last 3 results) Recent Labs    08/05/17 1004  BNP 940.5*    ProBNP (last 3 results) No results for input(s): PROBNP in the last 8760 hours.  Radiological Exams: Ct Abdomen Pelvis W Contrast  Result Date: 08/23/2017 CLINICAL DATA:  Possible pancreatic or peripancreatic mass on unenhanced CT abdomen and pelvis 2 days ago. Severe sepsis upon hospital admission 08/07/2017. EXAM: CT ABDOMEN AND PELVIS WITH CONTRAST TECHNIQUE: Multidetector CT imaging of the abdomen and pelvis was performed using the standard protocol following bolus administration of intravenous contrast. CONTRAST:  100mL OMNIPAQUE IOHEXOL 300 MG/ML IV. COMPARISON:  Unenhanced CT abdomen and pelvis 08/21/2017. FINDINGS: Lower chest: Coarse interstitial opacities in the visualized lung bases, unchanged from the CT 2 days ago. Moderate-sized LEFT pleural effusion and small RIGHT pleural effusion, unchanged. Stable moderate to marked cardiomegaly, mitral annular calcification and RIGHT coronary artery atherosclerosis. Hepatobiliary: Liver normal in size and appearance. Anatomic variant in that the LEFT lobe of the liver extends well across the midline into the LEFT UPPER QUADRANT. Small gallstones within the gallbladder. No pericholecystic inflammation. No biliary ductal dilation. Pancreas: Approximate 2.4 x 5.8 x 4.1 cm cystic  mass adjacent to and possibly arising from the head and uncinate of the pancreas. There are no visible internal septations, and the cyst fluid is of low attenuation measuring 9 Hounsfield units. Pancreas otherwise normal in appearance. No acute peripancreatic inflammation. Spleen: Normal in size and appearance. Adrenals/Urinary Tract: Normal appearing adrenal glands. Kidneys normal in size and appearance without focal parenchymal abnormality. No evidence of urinary tract  calculi or obstruction. Normal-appearing urinary bladder. Stomach/Bowel: Gastrostomy tube within the decompressed stomach. No complicating features. Diverticulum arising medially from the descending duodenum. Small bowel otherwise normal in appearance. Liquid stool throughout the colon. Sigmoid colon diverticulosis without evidence of acute diverticulitis. Linear opaque ingested material in the descending colon. Vascular/Lymphatic: Severe aorto-iliofemoral atherosclerosis without evidence of aneurysm. Normal-appearing portal venous and systemic venous systems. LEFT ovarian varicocele. Reproductive: Atrophic uterus and ovaries. Other: Dystrophic calcifications/myositis ossifications adjacent to the BILATERAL acetabula and ischia. Musculoskeletal: Degenerative disc disease at T11-12. Mild osseous demineralization. No acute findings. IMPRESSION: 1. Cystic mass adjacent to the head and uncinate process of the pancreas with simple cyst fluid, most likely representing a pseudocyst. Follow-up CT in 2-3 months is recommended. 2. Gastrostomy tube within the decompressed stomach. No complicating features. 3. Cholelithiasis without evidence of acute cholecystitis. 4. Sigmoid colon diverticulosis without evidence of acute diverticulitis. 5. Moderate-sized LEFT pleural effusion small RIGHT pleural effusion, unchanged since the CT 2 days ago. 6. Coarse interstitial opacities involving the visualized lung bases, unchanged. 7.  Aortic Atherosclerosis (ICD10-170.0) Electronically Signed   By: Hulan Saas M.D.   On: 08/23/2017 16:33    Assessment/Plan Active Problems:   Acute on chronic respiratory failure with hypoxia (HCC)   Severe sepsis with septic shock (HCC)   Healthcare-associated pneumonia   Acute metabolic encephalopathy   Alcohol withdrawal seizure with complication, with unspecified complication (HCC)   Severe mitral valve regurgitation   COPD (chronic obstructive pulmonary disease) (HCC)   1.  acute on  chronic Respiratory failure with hypoxia will continue with T collar weaning as ordered.  Patient has been tolerating it for 48 hr.  Continue with aggressive pulmonary toilet supportive care prognosis guarded.   2. Severe sepsis with shock hemodynamically stable  3. Healthcare associated pneumonia treated Will continue present management.   4. Acute metabolic encephalopathy grossly unchanged.   5. Alcohol withdrawal seizure no active seizures noted at this time.   6. Severe mitral regurg stable Will monitor  7. COPD severe disease at baseline we will continue present management   I have personally seen and evaluated the patient, evaluated laboratory and imaging results, formulated the assessment and plan and placed orders. The Patient requires high complexity decision making for assessment and support.  Case was discussed on Rounds with the Respiratory Therapy Staff  Yevonne Pax, MD [facility] Pulmonary Critical Care Medicine Sleep Medicine

## 2017-08-23 NOTE — Consult Note (Signed)
Referring Provider:  Dr. Anette Guarneri Primary Care Physician:  Shayne Alken, MD Primary Gastroenterologist:  Valeda Malm [facility]  Reason for Consultation:  Abnormal CT scan/pancreatic mass  HPI: Tiffany Gordon is a 67 y.o. [facility] of acute on chronic respiratory failure. PEG tube placed by IR 07/03. Because of anemia, CT abdomen pelvis without contrast was ordered to rule out reto peritoneal bleed which showed around 3.5 cm ill-defined low attenuation area in the head/uncinate process of the pancreas. GI is consulted for further evaluation.   Patient seen and examined at bedside. She appears chronically ill. Not able to obtain history from patient. Patient with multiple medical comorbidities including acute on chronic respiratory failure, bacteremia, history of alcohol use, healthcare acquired pneumonia, history of severe mitral valve regurgitation.  Apparently, she had CT abdomen pelvis on 08/12/2017 which showed normal-appearing pancreas. She subsequently had IR guided PEG placement and repeat CT showed questionable lesion in the pancreas.   Past Medical History:  Diagnosis Date  . Acute metabolic encephalopathy 08/07/2017  . Acute on chronic respiratory failure with hypoxia (HCC) 08/07/2017  . Alcohol withdrawal seizure with complication, with unspecified complication (HCC) 08/07/2017  . Anxiety disorder   . COPD (chronic obstructive pulmonary disease) (HCC)   . GERD without esophagitis   . Healthcare-associated pneumonia 08/07/2017  . History of esophagogastroduodenoscopy (EGD)   . Hypertension   . Severe mitral valve regurgitation 08/07/2017  . Severe sepsis with septic shock (HCC) 08/07/2017    Past Surgical History:  Procedure Laterality Date  . COLONOSCOPY    . COLONOSCOPY WITH ESOPHAGOGASTRODUODENOSCOPY (EGD)    . EYE SURGERY    . IR GASTROSTOMY TUBE MOD SED  08/15/2017  . TUBAL LIGATION       Prior to Admission medications   Not on File    Scheduled Meds: Continuous Infusions: PRN Meds:.  Allergies as of 08/02/2017  . (Not on File)    Family History  Family history unknown: Yes    Social History   Socioeconomic History  . Marital status: Married    Spouse name: Not on file  . Number of children: Not on file  . Years of education: Not on file  . Highest education level: Not on file  Occupational History  . Not on file  Social Needs  . Financial resource strain: Not on file  . Food insecurity:    Worry: Not on file    Inability: Not on file  . Transportation needs:    Medical: Not on file    Non-medical: Not on file  Tobacco Use  . Smoking status: Former Games developer  . Smokeless tobacco: Never Used  Substance and Sexual Activity  . Alcohol use: Not Currently  . Drug use: Not Currently  . Sexual activity: Not Currently  Lifestyle  . Physical activity:    Days per week: Not on file    Minutes per session: Not on file  . Stress: Not on file  Relationships  . Social connections:    Talks on phone: Not on file    Gets together: Not on file    Attends religious service: Not on file    Active member of club or organization: Not on file    Attends meetings of clubs or organizations: Not on file    Relationship status: Not on file  . Intimate partner violence:    Fear of current or ex partner: Not on file  Emotionally abused: Not on file    Physically abused: Not on file    Forced sexual activity: Not on file  Other Topics Concern  . Not on file  Social History Narrative  . Not on file    Review of Systems: not able to obtain  Physical Exam: Vital signs: Vitals:   08/15/17 1420 08/15/17 1426  BP: 136/72 (!) 142/76  Pulse: 99 96  Resp: (!) 24 20  SpO2: 99% 99%     General: chronically ill-appearing patient. Not in distress HEENT : extraocular movement intact. Oral mucosa dry. No oral lesions. Lungs:  Was breath sounds  bilaterally. Heart:  Regular rate and rhythm;  Abdomen: abdominal binder in place, abdomen is soft, nontender, bowel sounds present LE: no edema   GI:  Lab Results: Recent Labs    08/21/17 0710 08/22/17 0702 08/23/17 0625  WBC 20.4* 16.5* 15.4*  HGB 8.3* 7.7* 7.6*  HCT 26.9* 27.1* 26.1*  PLT 403* 422* 471*   BMET Recent Labs    08/21/17 0710 08/22/17 0702 08/23/17 0625  NA 135 138 138  K 4.7 4.3 3.8  CL 93* 94* 90*  CO2 29 33* 33*  GLUCOSE 93 83 93  BUN 51* 47* 47*  CREATININE 0.51 0.53 0.75  CALCIUM 9.3 9.6 9.6   LFT No results for input(s): PROT, ALBUMIN, AST, ALT, ALKPHOS, BILITOT, BILIDIR, IBILI in the last 72 hours. PT/INR No results for input(s): LABPROT, INR in the last 72 hours.   Studies/Results: No results found.  Impression/Plan: Abnormal CT scan concerning for pancreatic mass.CT abdomen pelvis without contrast was ordered to rule out reto peritoneal bleed which showed around 3.5 cm ill-defined low attenuation area in the head/uncinate process of the pancreas.  - PEG placement by IR 07/03  - acute on chronic respiratory failure - Chronic anemia - multiplel other medical comorbidities  Recommendation ---------------------------  Apparently, she had CT abdomen pelvis on 08/12/2017 which showed normal-appearing pancreas. She subsequently had IR guided PEG placement and repeat CT showed questionable lesion in the pancreas. - recommend repeat CT abdomen with IV contrast to rule out any complication from recent PEG placement.less likely to be pancreatic cancer given negative CT scan 10 days ago. - GI will follow depending on repeat CT scan results.    LOS: 0 days   Kathi DerParag Danialle Dement  MD, FACP 08/23/2017, 12:15 PM  Contact #  [phone]

## 2017-08-24 DIAGNOSIS — G9341 Metabolic encephalopathy: Secondary | ICD-10-CM | POA: Diagnosis not present

## 2017-08-24 DIAGNOSIS — F10239 Alcohol dependence with withdrawal, unspecified: Secondary | ICD-10-CM | POA: Diagnosis not present

## 2017-08-24 DIAGNOSIS — J449 Chronic obstructive pulmonary disease, unspecified: Secondary | ICD-10-CM | POA: Diagnosis not present

## 2017-08-24 DIAGNOSIS — J9621 Acute and chronic respiratory failure with hypoxia: Secondary | ICD-10-CM | POA: Diagnosis not present

## 2017-08-24 NOTE — Progress Notes (Signed)
[facility]  Tiffany NailerDeborah Parks Gordon 66 y.o. 09/11/1950  CC:  Abnormal CT concerning for pancreatic mass.   Subjective: not able to obtain meaningful history from patient.  ROS : not obtained   Objective: Vital signs in last 24 hours: Vitals:   08/15/17 1420 08/15/17 1426  BP: 136/72 (!) 142/76  Pulse: 99 96  Resp: (!) 24 20  SpO2: 99% 99%    Physical Exam:  General: chronically ill-appearing patient. Not in distress HEENT : extraocular movement intact. Oral mucosa dry.  Lungs:  coarse breath sounds bilaterally. Heart:  Regular rate and rhythm;  Abdomen: abdominal binder in place, abdomen is soft, nontender, bowel sounds present LE: no edema     Lab Results: Recent Labs    08/22/17 0702 08/23/17 0625  NA 138 138  K 4.3 3.8  CL 94* 90*  CO2 33* 33*  GLUCOSE 83 93  BUN 47* 47*  CREATININE 0.53 0.75  CALCIUM 9.6 9.6  MG 2.0 2.2  PHOS 5.6*  --    No results for input(s): AST, ALT, ALKPHOS, BILITOT, PROT, ALBUMIN in the last 72 hours. Recent Labs    08/22/17 0702 08/23/17 0625  WBC 16.5* 15.4*  HGB 7.7* 7.6*  HCT 27.1* 26.1*  MCV 94.4 94.2  PLT 422* 471*   No results for input(s): LABPROT, INR in the last 72 hours.    Assessment/Plan: Abnormal CT scan concerning for pancreatic mass.CT abdomen pelvis without contrast was ordered to rule out reto peritoneal bleed which showed around 3.5 cm ill-defined low attenuation area in the head/uncinate process of the pancreas. 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 - PEG placement by IR 07/03  - acute on chronic respiratory failure - Chronic anemia - multiplel other medical comorbidities  Recommendation --------------------------- - repeat CT with IV contrast yesterday showed Cystic mass adjacent to the head and uncinate process of the pancreas with simple cyst fluid, most likely representing a pseudocyst. Follow-up CT in 2-3 months is recommended - no further  inpatient GI workup recommended. GI will sign off. Call us back if needed   Tiffany DerParag Jackolyn Geron MD, FACP 08/24/2017, 9:57 AM  Contact #  [phone]

## 2017-08-24 NOTE — Progress Notes (Signed)
Pulmonary Critical Care Medicine [facility] [place]   PULMONARY SERVICE  PROGRESS NOTE  Date of Service: 08/24/2017  Tiffany Gordon  BJY:[id number]  DOB: 1950/12/19   DOA: 08/02/2017  Referring Physician: Carron Curie, MD  HPI: Tiffany Gordon is a 67 y.o. female seen for follow up of Acute on Chronic Respiratory Failure.  She remains on T collar at this time comfortable without distress trach does not change because of increased retained secretions.  Chest x-ray and CT scan were Artie reviewed.  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 General: Comfortable at this time . Eyes: Grossly normal lids, irises & conjunctiva . ENT: grossly tongue is normal . Neck: no obvious mass . Cardiovascular: S1 S2 normal no gallop . Respiratory: Coarse rhonchi noted bilaterally . Abdomen: soft . Skin: no rash seen on limited exam . Musculoskeletal: not rigid . Psychiatric:unable to assess . Neurologic: no seizure no involuntary movements         Lab Data:   Basic Metabolic Panel: Recent Labs  Lab 08/20/17 0653 08/21/17 0710 08/22/17 0702 08/23/17 0625  NA 134* 135 138 138  K 4.0 4.7 4.3 3.8  CL 93* 93* 94* 90*  CO2 31 29 33* 33*  GLUCOSE 120* 93 83 93  BUN 42* 51* 47* 47*  CREATININE 0.55 0.51 0.53 0.75  CALCIUM 9.2 9.3 9.6 9.6  MG 2.0 2.0 2.0 2.2  PHOS  --  5.6* 5.6*  --     Liver Function Tests: No results for input(s): AST, ALT, ALKPHOS, BILITOT, PROT, ALBUMIN in the last 168 hours. No results for input(s): LIPASE, AMYLASE in the last 168 hours. No results for input(s): AMMONIA in the last 168 hours.  CBC: Recent Labs  Lab 08/20/17 0653 08/20/17 1643 08/21/17 0710 08/22/17 0702 08/23/17 0625  WBC 17.7* 19.8* 20.4* 16.5*  15.4*  HGB 7.4* 8.1* 8.3* 7.7* 7.6*  HCT 26.1* 28.3* 26.9* 27.1* 26.1*  MCV 94.6 94.0 89.7 94.4 94.2  PLT 383 262 403* 422* 471*    Cardiac Enzymes: No results for input(s): CKTOTAL, CKMB, CKMBINDEX, TROPONINI in the last 168 hours.  BNP (last 3 results) Recent Labs    08/05/17 1004  BNP 940.5*    ProBNP (last 3 results) No results for input(s): PROBNP in the last 8760 hours.  Radiological Exams: Ct Abdomen Pelvis W Contrast  Result Date: 08/23/2017 CLINICAL DATA:  Possible pancreatic or peripancreatic mass on unenhanced CT abdomen and pelvis 2 days ago. Severe sepsis upon hospital admission 08/07/2017. EXAM: CT ABDOMEN AND PELVIS WITH CONTRAST TECHNIQUE: Multidetector CT imaging of the abdomen and pelvis was performed using the standard protocol following bolus administration of intravenous contrast. CONTRAST:  OMNIPAQUE IOHEXOL 300 MG/ML IV. COMPARISON:  Unenhanced CT abdomen and pelvis 08/21/2017. FINDINGS: Lower chest: Coarse interstitial opacities in the visualized lung bases, unchanged from the CT 2 days ago. Moderate-sized LEFT pleural effusion and small RIGHT pleural effusion, unchanged. Stable moderate to marked cardiomegaly, mitral annular calcification and RIGHT coronary artery atherosclerosis. Hepatobiliary: Liver normal in size and appearance. Anatomic variant in that the LEFT lobe of the liver extends well across the midline into the LEFT UPPER QUADRANT. Small gallstones within the gallbladder. No pericholecystic inflammation. No biliary ductal dilation. Pancreas: Approximate 2.4 x 5.8 x 4.1 cm cystic mass adjacent to  and possibly arising from the head and uncinate of the pancreas. There are no visible internal septations, and the cyst fluid is of low attenuation measuring 9 Hounsfield units. Pancreas otherwise normal in appearance. No acute peripancreatic inflammation. Spleen: Normal in size and appearance. Adrenals/Urinary Tract: Normal appearing adrenal glands. Kidneys  normal in size and appearance without focal parenchymal abnormality. No evidence of urinary tract calculi or obstruction. Normal-appearing urinary bladder. Stomach/Bowel: Gastrostomy tube within the decompressed stomach. No complicating features. Diverticulum arising medially from the descending duodenum. Small bowel otherwise normal in appearance. Liquid stool throughout the colon. Sigmoid colon diverticulosis without evidence of acute diverticulitis. Linear opaque ingested material in the descending colon. Vascular/Lymphatic: Severe aorto-iliofemoral atherosclerosis without evidence of aneurysm. Normal-appearing portal venous and systemic venous systems. LEFT ovarian varicocele. Reproductive: Atrophic uterus and ovaries. Other: Dystrophic calcifications/myositis ossifications adjacent to the BILATERAL acetabula and ischia. Musculoskeletal: Degenerative disc disease at T11-12. Mild osseous demineralization. No acute findings. IMPRESSION: 1. Cystic mass adjacent to the head and uncinate process of the pancreas with simple cyst fluid, most likely representing a pseudocyst. Follow-up CT in 2-3 months is recommended. 2. Gastrostomy tube within the decompressed stomach. No complicating features. 3. Cholelithiasis without evidence of acute cholecystitis. 4. Sigmoid colon diverticulosis without evidence of acute diverticulitis. 5. Moderate-sized LEFT pleural effusion small RIGHT pleural effusion, unchanged since the CT 2 days ago. 6. Coarse interstitial opacities involving the visualized lung bases, unchanged. 7.  Aortic Atherosclerosis (ICD10-170.0) Electronically Signed   By: Hulan Saashomas  Lawrence M.D.   On: 08/23/2017 16:33    Assessment/Plan Active Problems:   Acute on chronic respiratory failure with hypoxia (HCC)   Severe sepsis with septic shock (HCC)   Healthcare-associated pneumonia   Acute metabolic encephalopathy   Alcohol withdrawal seizure with complication, with unspecified complication (HCC)   Severe  mitral valve regurgitation   COPD (chronic obstructive pulmonary disease) (HCC)   1. Acute on chronic respiratory failure with hypoxia we will continue with T collar hold off on further progression at this time because of excessive secretions. 2. Severe sepsis with shock resolved 3. Healthcare associated pneumonia treated we will continue to follow 4. Alcohol withdrawal seizures no active this point 5. COPD severe disease we will continue present management 6. Metabolic encephalopathy grossly unchanged   I have personally seen and evaluated the patient, evaluated laboratory and imaging results, formulated the assessment and plan and placed orders. The Patient requires high complexity decision making for assessment and support.  Case was discussed on Rounds with the Respiratory Therapy Staff  Yevonne PaxSaadat A Khan, MD [facility] Pulmonary Critical Care Medicine Sleep Medicine

## 2017-08-25 LAB — BASIC METABOLIC PANEL
Anion gap: 12 (ref 5–15)
BUN: 46 mg/dL — AB (ref 8–23)
CHLORIDE: 96 mmol/L — AB (ref 98–111)
CO2: 35 mmol/L — ABNORMAL HIGH (ref 22–32)
Calcium: 9.8 mg/dL (ref 8.9–10.3)
Creatinine, Ser: 0.85 mg/dL (ref 0.44–1.00)
GFR calc Af Amer: >60 mL/min (ref >60)
GFR calc non Af Amer: >60 mL/min (ref >60)
Glucose, Bld: 113 mg/dL — ABNORMAL HIGH (ref 70–99)
POTASSIUM: 3.4 mmol/L — AB (ref 3.5–5.1)
SODIUM: 143 mmol/L (ref 135–145)

## 2017-08-25 LAB — CBC
HEMATOCRIT: 29.1 % — AB (ref 36.0–46.0)
HEMOGLOBIN: 8.2 g/dL — AB (ref 12.0–15.0)
MCH: 26.5 pg (ref 26.0–34.0)
MCHC: 28.2 g/dL — ABNORMAL LOW (ref 30.0–36.0)
MCV: 93.9 fL (ref 78.0–100.0)
Platelets: 551 10*3/uL — ABNORMAL HIGH (ref 150–400)
RBC: 3.1 MIL/uL — AB (ref 3.87–5.11)
RDW: 17.8 % — ABNORMAL HIGH (ref 11.5–15.5)
WBC: 18.3 10*3/uL — ABNORMAL HIGH (ref 4.0–10.5)

## 2017-08-25 LAB — PHOSPHORUS: Phosphorus: 5.6 mg/dL — ABNORMAL HIGH (ref 2.5–4.6)

## 2017-08-25 LAB — MAGNESIUM: MAGNESIUM: 2.4 mg/dL (ref 1.7–2.4)

## 2017-08-26 DIAGNOSIS — J189 Pneumonia, unspecified organism: Secondary | ICD-10-CM | POA: Diagnosis not present

## 2017-08-26 DIAGNOSIS — I340 Nonrheumatic mitral (valve) insufficiency: Secondary | ICD-10-CM | POA: Diagnosis not present

## 2017-08-26 DIAGNOSIS — J90 Pleural effusion, not elsewhere classified: Secondary | ICD-10-CM | POA: Diagnosis not present

## 2017-08-26 DIAGNOSIS — G9341 Metabolic encephalopathy: Secondary | ICD-10-CM | POA: Diagnosis not present

## 2017-08-26 DIAGNOSIS — J449 Chronic obstructive pulmonary disease, unspecified: Secondary | ICD-10-CM | POA: Diagnosis not present

## 2017-08-26 DIAGNOSIS — A419 Sepsis, unspecified organism: Secondary | ICD-10-CM | POA: Diagnosis not present

## 2017-08-26 DIAGNOSIS — J9621 Acute and chronic respiratory failure with hypoxia: Secondary | ICD-10-CM | POA: Diagnosis not present

## 2017-08-26 DIAGNOSIS — R000 Tachycardia, unspecified: Secondary | ICD-10-CM | POA: Diagnosis not present

## 2017-08-26 DIAGNOSIS — R6521 Severe sepsis with septic shock: Secondary | ICD-10-CM | POA: Diagnosis not present

## 2017-08-26 DIAGNOSIS — F10239 Alcohol dependence with withdrawal, unspecified: Secondary | ICD-10-CM | POA: Diagnosis not present

## 2017-08-26 DIAGNOSIS — R569 Unspecified convulsions: Secondary | ICD-10-CM | POA: Diagnosis not present

## 2017-08-26 LAB — URINALYSIS, ROUTINE W REFLEX MICROSCOPIC
BACTERIA UA: NONE SEEN
BILIRUBIN URINE: NEGATIVE
GLUCOSE, UA: NEGATIVE mg/dL
KETONES UR: NEGATIVE mg/dL
LEUKOCYTES UA: NEGATIVE
Nitrite: NEGATIVE
PH: 8 (ref 5.0–8.0)
Protein, ur: NEGATIVE mg/dL
SPECIFIC GRAVITY, URINE: 1.016 (ref 1.005–1.030)

## 2017-08-26 LAB — C DIFFICILE QUICK SCREEN W PCR REFLEX
C Diff antigen: NEGATIVE
C Diff interpretation: NOT DETECTED
C Diff toxin: NEGATIVE

## 2017-08-26 NOTE — Progress Notes (Signed)
Pulmonary Critical Care Medicine [facility] [place]   PULMONARY SERVICE  PROGRESS NOTE  Date of Service: 08/26/2017  Tiffany Gordon  HKV:[id number]  DOB: 03/24/1950   DOA: 08/02/2017  Referring Physician: Carron CurieAli Hijazi, MD  HPI: Tiffany Gordon is a 67 y.o. female seen for follow up of Acute on Chronic Respiratory Failure.  Patient's on T collar good saturations are noted.  Patient is comfortable without distress at this time  Medications: Reviewed on Rounds  Physical Exam:  Vitals: Temperature 97.4 pulse 89 respiratory rate 21 blood pressure 109/68 saturations 94%  Ventilator Settings patient's on T collar 28%  . General: Comfortable at this time . Eyes: Grossly normal lids, irises & conjunctiva . ENT: grossly tongue is normal . Neck: no obvious mass . Cardiovascular: S1 S2 normal no gallop . Respiratory: Scattered rhonchi . Abdomen: soft . Skin: no rash seen on limited exam . Musculoskeletal: not rigid . Psychiatric:unable to assess . Neurologic: no seizure no involuntary movements         Lab Data:   Basic Metabolic Panel: Recent Labs  Lab 08/20/17 0653 08/21/17 0710 08/22/17 0702 08/23/17 0625 08/25/17 0624  NA 134* 135 138 138 143  K 4.0 4.7 4.3 3.8 3.4*  CL 93* 93* 94* 90* 96*  CO2 31 29 33* 33* 35*  GLUCOSE 120* 93 83 93 113*  BUN 42* 51* 47* 47* 46*  CREATININE 0.55 0.51 0.53 0.75 0.85  CALCIUM 9.2 9.3 9.6 9.6 9.8  MG 2.0 2.0 2.0 2.2 2.4  PHOS  --  5.6* 5.6*  --  5.6*    Liver Function Tests: No results for input(s): AST, ALT, ALKPHOS, BILITOT, PROT, ALBUMIN in the last 168 hours. No results for input(s): LIPASE, AMYLASE in the last 168 hours. No results for input(s): AMMONIA in the last 168 hours.  CBC: Recent Labs  Lab 08/20/17 1643 08/21/17 0710 08/22/17 0702 08/23/17 0625 08/25/17 0624  WBC 19.8* 20.4* 16.5* 15.4* 18.3*  HGB 8.1* 8.3* 7.7* 7.6* 8.2*  HCT 28.3* 26.9* 27.1* 26.1* 29.1*  MCV 94.0 89.7 94.4 94.2 93.9   PLT 262 403* 422* 471* 551*    Cardiac Enzymes: No results for input(s): CKTOTAL, CKMB, CKMBINDEX, TROPONINI in the last 168 hours.  BNP (last 3 results) Recent Labs    08/05/17 1004  BNP 940.5*    ProBNP (last 3 results) No results for input(s): PROBNP in the last 8760 hours.  Radiological Exams: No results found.  Assessment/Plan Active Problems:   Acute on chronic respiratory failure with hypoxia (HCC)   Severe sepsis with septic shock (HCC)   Healthcare-associated pneumonia   Acute metabolic encephalopathy   Alcohol withdrawal seizure with complication, with unspecified complication (HCC)   Severe mitral valve regurgitation   COPD (chronic obstructive pulmonary disease) (HCC)   1. Acute on chronic respiratory failure with hypoxia clinically improved we will continue present management.  Continue with pulmonary toilet. 2. Healthcare associated pneumonia treated with antibiotics we will monitor 3. Sepsis with shock hemodynamically stable 4. Alcohol withdrawal no active seizures noted 5. COPD severe disease continue present management 6. Severe mitral regurg at baseline   I have personally seen and evaluated the patient, evaluated laboratory and imaging results, formulated the assessment and plan and placed orders. The Patient requires high complexity decision making for assessment and support.  Case was discussed on Rounds with the Respiratory Therapy Staff  Yevonne PaxSaadat A Atara Paterson, MD [facility] Pulmonary Critical Care Medicine Sleep Medicine

## 2017-08-27 DIAGNOSIS — J9621 Acute and chronic respiratory failure with hypoxia: Secondary | ICD-10-CM | POA: Diagnosis not present

## 2017-08-27 DIAGNOSIS — F10239 Alcohol dependence with withdrawal, unspecified: Secondary | ICD-10-CM | POA: Diagnosis not present

## 2017-08-27 DIAGNOSIS — G9341 Metabolic encephalopathy: Secondary | ICD-10-CM | POA: Diagnosis not present

## 2017-08-27 DIAGNOSIS — J449 Chronic obstructive pulmonary disease, unspecified: Secondary | ICD-10-CM | POA: Diagnosis not present

## 2017-08-27 LAB — CBC
HCT: 31.1 % — ABNORMAL LOW (ref 36.0–46.0)
Hemoglobin: 8.6 g/dL — ABNORMAL LOW (ref 12.0–15.0)
MCH: 25.9 pg — AB (ref 26.0–34.0)
MCHC: 27.7 g/dL — ABNORMAL LOW (ref 30.0–36.0)
MCV: 93.7 fL (ref 78.0–100.0)
PLATELETS: 640 10*3/uL — AB (ref 150–400)
RBC: 3.32 MIL/uL — AB (ref 3.87–5.11)
RDW: 17.4 % — AB (ref 11.5–15.5)
WBC: 18.9 10*3/uL — ABNORMAL HIGH (ref 4.0–10.5)

## 2017-08-27 LAB — CULTURE, RESPIRATORY W GRAM STAIN

## 2017-08-27 LAB — URINE CULTURE: Culture: NO GROWTH

## 2017-08-27 LAB — BLOOD GAS, ARTERIAL
Acid-Base Excess: 11.6 mmol/L — ABNORMAL HIGH (ref 0.0–2.0)
Bicarbonate: 35.2 mmol/L — ABNORMAL HIGH (ref 20.0–28.0)
FIO2: 60
O2 Saturation: 91.8 %
PATIENT TEMPERATURE: 98.6
PO2 ART: 60.1 mmHg — AB (ref 83.0–108.0)
pCO2 arterial: 41.6 mmHg (ref 32.0–48.0)
pH, Arterial: 7.537 — ABNORMAL HIGH (ref 7.350–7.450)

## 2017-08-27 LAB — BASIC METABOLIC PANEL
Anion gap: 14 (ref 5–15)
BUN: 54 mg/dL — AB (ref 8–23)
CO2: 35 mmol/L — ABNORMAL HIGH (ref 22–32)
Calcium: 9.9 mg/dL (ref 8.9–10.3)
Chloride: 99 mmol/L (ref 98–111)
Creatinine, Ser: 0.88 mg/dL (ref 0.44–1.00)
GFR calc Af Amer: >60 mL/min (ref >60)
GLUCOSE: 123 mg/dL — AB (ref 70–99)
POTASSIUM: 3.8 mmol/L (ref 3.5–5.1)
Sodium: 148 mmol/L — ABNORMAL HIGH (ref 135–145)

## 2017-08-27 LAB — PHOSPHORUS: Phosphorus: 6 mg/dL — ABNORMAL HIGH (ref 2.5–4.6)

## 2017-08-27 LAB — CULTURE, RESPIRATORY

## 2017-08-27 LAB — MAGNESIUM: Magnesium: 2.3 mg/dL (ref 1.7–2.4)

## 2017-08-27 NOTE — Progress Notes (Signed)
[facility] [place]   PULMONARY SERVICE  PROGRESS NOTE  Date of Service: 08/27/2017  Suzan NailerDeborah Parks Warrell  UJW:[id number]  DOB: 12/22/1950   DOA: 08/02/2017  Referring Physician: Carron CurieAli Hijazi, MD  HPI: Suzan NailerDeborah Parks Herling is a 67 y.o. female seen for follow up of Acute on Chronic Respiratory Failure.  Continues on T collar.  Patient was attempted at capping however did not tolerate.  Patient has increased secretions noted.  Medications: Reviewed on Rounds  Physical Exam:  Vitals: Temperature 97.2 pulse 90 respiratory rate 26 blood pressure 113/59 saturations 95%  Ventilator Settings off the ventilator on T collar  . General: Comfortable at this time . Eyes: Grossly normal lids, irises & conjunctiva . ENT: grossly tongue is normal . Neck: no obvious mass . Cardiovascular: S1 S2 normal no gallop . Respiratory: No rhonchi or rales . Abdomen: soft . Skin: no rash seen on limited exam . Musculoskeletal: not rigid . Psychiatric:unable to assess . Neurologic: no seizure no involuntary movements         Lab Data:   Basic Metabolic Panel: Recent Labs  Lab 08/21/17 0710 08/22/17 0702 08/23/17 0625 08/25/17 0624 08/27/17 0711  NA 135 138 138 143 148*  K 4.7 4.3 3.8 3.4* 3.8  CL 93* 94* 90* 96* 99  CO2 29 33* 33* 35* 35*  GLUCOSE 93 83 93 113* 123*  BUN 51* 47* 47* 46* 54*  CREATININE 0.51 0.53 0.75 0.85 0.88  CALCIUM 9.3 9.6 9.6 9.8 9.9  MG 2.0 2.0 2.2 2.4 2.3  PHOS 5.6* 5.6*  --  5.6* 6.0*    Liver Function Tests: No results for input(s): AST, ALT, ALKPHOS, BILITOT, PROT, ALBUMIN in the last 168 hours. No results for input(s): LIPASE, AMYLASE in the last 168 hours. No results for input(s): AMMONIA in the last 168 hours.  CBC: Recent Labs  Lab 08/21/17 0710 08/22/17 0702 08/23/17 0625 08/25/17 0624 08/27/17 0711  WBC 20.4* 16.5* 15.4* 18.3* 18.9*  HGB 8.3* 7.7* 7.6* 8.2* 8.6*  HCT 26.9* 27.1* 26.1* 29.1* 31.1*   MCV 89.7 94.4 94.2 93.9 93.7  PLT 403* 422* 471* 551* 640*    Cardiac Enzymes: No results for input(s): CKTOTAL, CKMB, CKMBINDEX, TROPONINI in the last 168 hours.  BNP (last 3 results) Recent Labs    08/05/17 1004  BNP 940.5*    ProBNP (last 3 results) No results for input(s): PROBNP in the last 8760 hours.  Radiological Exams: No results found.  Assessment/Plan Active Problems:   Acute on chronic respiratory failure with hypoxia (HCC)   Severe sepsis with septic shock (HCC)   Healthcare-associated pneumonia   Acute metabolic encephalopathy   Alcohol withdrawal seizure with complication, with unspecified complication (HCC)   Severe mitral valve regurgitation   COPD (chronic obstructive pulmonary disease) (HCC)   1. Acute on chronic respiratory failure with hypoxia patient is going to continue with T collar trials at this time.  Had staph growing in the sputum.  Discussed with primary care regarding antibiotic choice.  We will continue with supportive care 2. Healthcare associated pneumonia treated still has some staph in the sputum cultures 3. Metabolic encephalopathy grossly unchanged 4. Alcohol withdrawal seizures no active seizures noted 5. Severe mitral regurg at baseline 6. COPD severe disease we will continue to follow 7. Sepsis resolved   I have personally seen and evaluated the patient, evaluated laboratory and imaging results, formulated the assessment and plan and placed orders. The Patient requires high complexity decision  making for assessment and support.  Case was discussed on Rounds with the Respiratory Therapy Staff  Allyne Gee, MD [facility] Pulmonary Critical Care Medicine Sleep Medicine

## 2017-08-28 ENCOUNTER — Other Ambulatory Visit (HOSPITAL_COMMUNITY): Payer: Medicare Other

## 2017-08-28 DIAGNOSIS — R000 Tachycardia, unspecified: Secondary | ICD-10-CM

## 2017-08-28 DIAGNOSIS — G9341 Metabolic encephalopathy: Secondary | ICD-10-CM | POA: Diagnosis not present

## 2017-08-28 DIAGNOSIS — J449 Chronic obstructive pulmonary disease, unspecified: Secondary | ICD-10-CM | POA: Diagnosis not present

## 2017-08-28 DIAGNOSIS — F10239 Alcohol dependence with withdrawal, unspecified: Secondary | ICD-10-CM | POA: Diagnosis not present

## 2017-08-28 DIAGNOSIS — J9621 Acute and chronic respiratory failure with hypoxia: Secondary | ICD-10-CM | POA: Diagnosis not present

## 2017-08-28 LAB — GLUCOSE, PLEURAL OR PERITONEAL FLUID: GLUCOSE FL: 139 mg/dL

## 2017-08-28 LAB — BASIC METABOLIC PANEL
Anion gap: 15 (ref 5–15)
BUN: 57 mg/dL — ABNORMAL HIGH (ref 8–23)
CALCIUM: 9.5 mg/dL (ref 8.9–10.3)
CO2: 33 mmol/L — ABNORMAL HIGH (ref 22–32)
CREATININE: 0.97 mg/dL (ref 0.44–1.00)
Chloride: 102 mmol/L (ref 98–111)
GFR calc non Af Amer: 60 mL/min — ABNORMAL LOW (ref >60)
Glucose, Bld: 138 mg/dL — ABNORMAL HIGH (ref 70–99)
Potassium: 4.8 mmol/L (ref 3.5–5.1)
SODIUM: 150 mmol/L — AB (ref 135–145)

## 2017-08-28 LAB — BLOOD GAS, ARTERIAL
Acid-Base Excess: 9.3 mmol/L — ABNORMAL HIGH (ref 0.0–2.0)
BICARBONATE: 33 mmol/L — AB (ref 20.0–28.0)
FIO2: 40
O2 SAT: 98.7 %
PATIENT TEMPERATURE: 98.6
PCO2 ART: 43 mmHg (ref 32.0–48.0)
PEEP: 5 cmH2O
PO2 ART: 104 mmHg (ref 83.0–108.0)
PRESSURE CONTROL: 16 cmH2O
pH, Arterial: 7.497 — ABNORMAL HIGH (ref 7.350–7.450)

## 2017-08-28 LAB — CBC
HCT: 31 % — ABNORMAL LOW (ref 36.0–46.0)
Hemoglobin: 8.4 g/dL — ABNORMAL LOW (ref 12.0–15.0)
MCH: 25.7 pg — ABNORMAL LOW (ref 26.0–34.0)
MCHC: 27.1 g/dL — AB (ref 30.0–36.0)
MCV: 94.8 fL (ref 78.0–100.0)
Platelets: 705 10*3/uL — ABNORMAL HIGH (ref 150–400)
RBC: 3.27 MIL/uL — ABNORMAL LOW (ref 3.87–5.11)
RDW: 17.7 % — AB (ref 11.5–15.5)
WBC: 21.1 10*3/uL — ABNORMAL HIGH (ref 4.0–10.5)

## 2017-08-28 LAB — PROTEIN, PLEURAL OR PERITONEAL FLUID: Total protein, fluid: 4.7 g/dL

## 2017-08-28 LAB — MAGNESIUM: MAGNESIUM: 2.2 mg/dL (ref 1.7–2.4)

## 2017-08-28 LAB — LACTIC ACID, PLASMA
Lactic Acid, Venous: 2.5 mmol/L (ref 0.5–1.9)
Lactic Acid, Venous: 1.9 mmol/L (ref 0.5–1.9)
Lactic Acid, Venous: 2.2 mmol/L (ref 0.5–1.9)

## 2017-08-28 LAB — PHOSPHORUS: Phosphorus: 5.9 mg/dL — ABNORMAL HIGH (ref 2.5–4.6)

## 2017-08-28 LAB — VANCOMYCIN, TROUGH: VANCOMYCIN TR: 16 ug/mL (ref 15–20)

## 2017-08-28 MED ORDER — LIDOCAINE HCL (PF) 1 % IJ SOLN
INTRAMUSCULAR | Status: AC
  Filled 2017-08-28: qty 10

## 2017-08-28 MED ORDER — GENERIC EXTERNAL MEDICATION
Status: DC
Start: ? — End: 2017-08-28

## 2017-08-28 NOTE — Progress Notes (Signed)
[facility]   PULMONARY SERVICE  PROGRESS NOTE  Date of Service: 08/28/2017  Tiffany Gordon  JYN:[id number]  DOB: 06/23/1950   DOA: 08/02/2017  Referring Physician: Carron CurieAli Hijazi, MD  HPI: Tiffany Gordon is a [age] y.o. female seen for follow up of Acute on Chronic Respiratory Failure.  Patient is on assist control at this time has been on 40% oxygen and seems to be in no distress.  The patient was placed back on the ventilator overnight because of increased heart rate.  Patient's current heart rate was elevated also and she seemed to be a little bit more distress.  Chest x-ray has not been done which I went ahead and ordered.  Medications: Reviewed on Rounds  Physical Exam:  Vitals: Temperature 97.0 pulse 144 respiratory rate 35 blood pressure 98/55 saturations 96%  Ventilator Settings mode of ventilation assist control FiO2 40% tidal volume 345 PEEP 5  . General: Comfortable at this time . Eyes: Grossly normal lids, irises & conjunctiva . ENT: grossly tongue is normal . Neck: no obvious mass . Cardiovascular: S1 S2 normal no gallop tachycardic . Respiratory: Coarse rhonchi are noted at this time. . Abdomen: soft . Skin: no rash seen on limited exam . Musculoskeletal: not rigid . Psychiatric:unable to assess . Neurologic: no seizure no involuntary movements         Lab Data:   Basic Metabolic Panel: Recent Labs  Lab 08/22/17 0702 08/23/17 0625 08/25/17 0624 08/27/17 0711 08/28/17 0847  NA 138 138 143 148* 150*  K 4.3 3.8 3.4* 3.8 4.8  CL 94* 90* 96* 99 102  CO2 33* 33* 35* 35* 33*  GLUCOSE 83 93 113* 123* 138*  BUN 47* 47* 46* 54* 57*  CREATININE 0.53 0.75 0.85 0.88 0.97  CALCIUM 9.6 9.6 9.8 9.9 9.5  MG 2.0 2.2 2.4 2.3 2.2  PHOS 5.6*  --  5.6* 6.0* 5.9*    Liver Function Tests: No results for input(s): AST, ALT, ALKPHOS, BILITOT, PROT, ALBUMIN in the last 168 hours. No results for input(s): LIPASE,  AMYLASE in the last 168 hours. No results for input(s): AMMONIA in the last 168 hours.  CBC: Recent Labs  Lab 08/22/17 0702 08/23/17 0625 08/25/17 0624 08/27/17 0711 08/28/17 0847  WBC 16.5* 15.4* 18.3* 18.9* 21.1*  HGB 7.7* 7.6* 8.2* 8.6* 8.4*  HCT 27.1* 26.1* 29.1* 31.1* 31.0*  MCV 94.4 94.2 93.9 93.7 94.8  PLT 422* 471* 551* 640* 705*    Cardiac Enzymes: No results for input(s): CKTOTAL, CKMB, CKMBINDEX, TROPONINI in the last 168 hours.  BNP (last 3 results) Recent Labs    08/05/17 1004  BNP 940.5*    ProBNP (last 3 results) No results for input(s): PROBNP in the last 8760 hours.  Radiological Exams: Dg Chest Port 1 View  Result Date: 08/28/2017 CLINICAL DATA:  Respiratory failure. Tracheostomy present. On ventilator. EXAM: PORTABLE CHEST 1 VIEW COMPARISON:  CT chest of 08/21/2017 and chest x-ray of 08/19/2017 FINDINGS: There is perhaps slightly more opacity at the left lung base which may indicate an increase in left pleural effusion and atelectasis, although pneumonia cannot be excluded. No right pleural effusion is seen. Mild cardiomegaly is stable. Tracheostomy remains. IMPRESSION: 1. Slight increase in opacity at the left lung base may indicate increasing left pleural effusion and atelectasis. Cannot exclude pneumonia. 2. Stable mild cardiomegaly. Electronically Signed   By: Dwyane DeePaul  Barry M.D.   On: 08/28/2017 08:06    Assessment/Plan Active Problems:  Acute on chronic respiratory failure with hypoxia (HCC)   Severe sepsis with septic shock (HCC)   Healthcare-associated pneumonia   Acute metabolic encephalopathy   Alcohol withdrawal seizure with complication, with unspecified complication (HCC)   Severe mitral valve regurgitation   COPD (chronic obstructive pulmonary disease) (HCC)   1. Acute on chronic respiratory failure with hypoxia patient had acute decompensation overnight had to be placed back on the ventilator off with T bar and now back on the vent.   Currently is requiring 40% oxygen with high support.  Patient is also tachycardic unsure if this is flutter or possibly related to the acute respiratory decompensation.  I have ordered for a chest x-ray to be done.  Also in the differential should be considered his pulmonary embolism we will discuss this with the primary care team. 2. Severe sepsis with shock blood pressure is on the low side will continue to assess may need to be placed on pressors. 3. Healthcare associated pneumonia has been treated however the last chest x-ray had shown some increased opacities. 4. Acute metabolic encephalopathy she is nonverbal at this time. 5. Alcohol withdrawal no active seizures 6. COPD severe disease we will continue with present management. 7. Severe mitral regurgitation we will continue to monitor prognosis guarded   I have personally seen and evaluated the patient, evaluated laboratory and imaging results, formulated the assessment and plan and placed orders. The Patient requires high complexity decision making for assessment and support.  Case was discussed on Rounds with the Respiratory Therapy Staff patient is critically ill in danger of cardiac arrest and death time spent 35 minutes coordination of care with the staff and reviewing the chart as well as requiring current studies patient is going to require advanced monitoring techniques  Yevonne Pax, MD [facility] Pulmonary Critical Care Medicine Sleep Medicine

## 2017-08-28 NOTE — Procedures (Signed)
PROCEDURE SUMMARY:  Successful image-guided left thoracentesis. Yielded 140 milliliters of serosanguinous fluid. Patient tolerated procedure well. No immediate complications.  Specimen was sent for labs. CXR ordered.  Villa HerbShannon A Brantley Wiley PA-C 08/28/2017 1:51 PM

## 2017-08-29 DIAGNOSIS — J449 Chronic obstructive pulmonary disease, unspecified: Secondary | ICD-10-CM | POA: Diagnosis not present

## 2017-08-29 DIAGNOSIS — I340 Nonrheumatic mitral (valve) insufficiency: Secondary | ICD-10-CM | POA: Diagnosis not present

## 2017-08-29 DIAGNOSIS — J9621 Acute and chronic respiratory failure with hypoxia: Secondary | ICD-10-CM | POA: Diagnosis not present

## 2017-08-29 DIAGNOSIS — J189 Pneumonia, unspecified organism: Secondary | ICD-10-CM | POA: Diagnosis not present

## 2017-08-29 DIAGNOSIS — J90 Pleural effusion, not elsewhere classified: Secondary | ICD-10-CM | POA: Diagnosis not present

## 2017-08-29 DIAGNOSIS — F10239 Alcohol dependence with withdrawal, unspecified: Secondary | ICD-10-CM | POA: Diagnosis not present

## 2017-08-29 DIAGNOSIS — R569 Unspecified convulsions: Secondary | ICD-10-CM | POA: Diagnosis not present

## 2017-08-29 DIAGNOSIS — G9341 Metabolic encephalopathy: Secondary | ICD-10-CM | POA: Diagnosis not present

## 2017-08-29 LAB — LACTIC ACID, PLASMA
LACTIC ACID, VENOUS: 2.6 mmol/L — AB (ref 0.5–1.9)
Lactic Acid, Venous: 2.4 mmol/L (ref 0.5–1.9)
Lactic Acid, Venous: 2.1 mmol/L (ref 0.5–1.9)
Lactic Acid, Venous: 2.2 mmol/L (ref 0.5–1.9)

## 2017-08-29 LAB — BASIC METABOLIC PANEL
Anion gap: 14 (ref 5–15)
BUN: 63 mg/dL — ABNORMAL HIGH (ref 8–23)
CO2: 28 mmol/L (ref 22–32)
Calcium: 9.1 mg/dL (ref 8.9–10.3)
Chloride: 105 mmol/L (ref 98–111)
Creatinine, Ser: 1.09 mg/dL — ABNORMAL HIGH (ref 0.44–1.00)
GFR calc Af Amer: >60 mL/min (ref >60)
GFR calc non Af Amer: 52 mL/min — ABNORMAL LOW (ref >60)
Glucose, Bld: 89 mg/dL (ref 70–99)
Potassium: 4.8 mmol/L (ref 3.5–5.1)
Sodium: 147 mmol/L — ABNORMAL HIGH (ref 135–145)

## 2017-08-29 LAB — CBC
HEMATOCRIT: 29.6 % — AB (ref 36.0–46.0)
HEMOGLOBIN: 8 g/dL — AB (ref 12.0–15.0)
MCH: 26.1 pg (ref 26.0–34.0)
MCHC: 27 g/dL — AB (ref 30.0–36.0)
MCV: 96.4 fL (ref 78.0–100.0)
Platelets: 516 10*3/uL — ABNORMAL HIGH (ref 150–400)
RBC: 3.07 MIL/uL — AB (ref 3.87–5.11)
RDW: 17.9 % — ABNORMAL HIGH (ref 11.5–15.5)
WBC: 21.1 10*3/uL — ABNORMAL HIGH (ref 4.0–10.5)

## 2017-08-29 LAB — PH, BODY FLUID: pH, Body Fluid: 7.4

## 2017-08-29 LAB — URINALYSIS, ROUTINE W REFLEX MICROSCOPIC
BILIRUBIN URINE: NEGATIVE
GLUCOSE, UA: NEGATIVE mg/dL
Ketones, ur: NEGATIVE mg/dL
NITRITE: NEGATIVE
Protein, ur: NEGATIVE mg/dL
Specific Gravity, Urine: 1.018 (ref 1.005–1.030)
pH: 6 (ref 5.0–8.0)

## 2017-08-29 LAB — MAGNESIUM: Magnesium: 2.3 mg/dL (ref 1.7–2.4)

## 2017-08-29 LAB — PHOSPHORUS: PHOSPHORUS: 5.6 mg/dL — AB (ref 2.5–4.6)

## 2017-08-29 NOTE — Progress Notes (Addendum)
[facility] [place]   PULMONARY SERVICE  PROGRESS NOTE  Date of Service: 08/29/2017  Tiffany NailerDeborah Parks Gordon  WUJ:[id number]  DOB: 07/01/1950   DOA: 08/02/2017  Referring Physician: Carron CurieAli Hijazi, MD  HPI: Tiffany Gordon is a 67 y.o. female seen for follow up of Acute on Chronic Respiratory Failure.  Patient remains on full support right now currently is on pressure assist control mode patient's been tolerating 30% FiO2.  Heart rate is improved today.  Seems a little bit more stable hopefully should be able to try to wean again  Medications: Reviewed on Rounds  Physical Exam:  Vitals: Temperature 98.9 pulse 72 respiratory rate 30 blood pressure 104 63 saturations 98%  Ventilator Settings mode of ventilation pressure assist control FiO2 30% tidal volume 346 PEEP 5  . General: Comfortable at this time . Eyes: Grossly normal lids, irises & conjunctiva . ENT: grossly tongue is normal . Neck: no obvious mass . Cardiovascular: S1 S2 normal no gallop . Respiratory: No rhonchi or rales . Abdomen: soft . Skin: no rash seen on limited exam . Musculoskeletal: not rigid . Psychiatric:unable to assess . Neurologic: no seizure no involuntary movements         Lab Data:   Basic Metabolic Panel: Recent Labs  Lab 08/23/17 0625 08/25/17 0624 08/27/17 0711 08/28/17 0847 08/29/17 0225  NA 138 143 148* 150* 147*  K 3.8 3.4* 3.8 4.8 4.8  CL 90* 96* 99 102 105  CO2 33* 35* 35* 33* 28  GLUCOSE 93 113* 123* 138* 89  BUN 47* 46* 54* 57* 63*  CREATININE 0.75 0.85 0.88 0.97 1.09*  CALCIUM 9.6 9.8 9.9 9.5 9.1  MG 2.2 2.4 2.3 2.2 2.3  PHOS  --  5.6* 6.0* 5.9* 5.6*    Liver Function Tests: No results for input(s): AST, ALT, ALKPHOS, BILITOT, PROT, ALBUMIN in the last 168 hours. No results for input(s): LIPASE, AMYLASE in the last 168 hours. No results for input(s): AMMONIA in the last 168 hours.  CBC: Recent Labs  Lab 08/23/17 0625  08/25/17 0624 08/27/17 0711 08/28/17 0847 08/29/17 0225  WBC 15.4* 18.3* 18.9* 21.1* 21.1*  HGB 7.6* 8.2* 8.6* 8.4* 8.0*  HCT 26.1* 29.1* 31.1* 31.0* 29.6*  MCV 94.2 93.9 93.7 94.8 96.4  PLT 471* 551* 640* 705* 516*    Cardiac Enzymes: No results for input(s): CKTOTAL, CKMB, CKMBINDEX, TROPONINI in the last 168 hours.  BNP (last 3 results) Recent Labs    08/05/17 1004  BNP 940.5*    ProBNP (last 3 results) No results for input(s): PROBNP in the last 8760 hours.  Radiological Exams: Dg Chest 1 View  Result Date: 08/28/2017 CLINICAL DATA:  Pleural effusion.  Left thoracentesis. EXAM: CHEST  1 VIEW COMPARISON:  08/28/2017. FINDINGS: Tracheostomy tube noted in stable position. Cardiomegaly with normal pulmonary vascularity. Bilateral pulmonary interstitial changes with small bilateral pleural effusions. No pneumothorax. IMPRESSION: 1.  Tracheostomy tube noted stable position. 2. Cardiomegaly with mild bilateral interstitial prominence and bilateral pleural effusions suggesting CHF. Similar findings noted on prior exam. A component of chronic interstitial lung disease may be present. Electronically Signed   By: Maisie Fushomas  Register   On: 08/28/2017 15:08   Dg Chest Port 1 View  Result Date: 08/28/2017 CLINICAL DATA:  Respiratory failure. Tracheostomy present. On ventilator. EXAM: PORTABLE CHEST 1 VIEW COMPARISON:  CT chest of 08/21/2017 and chest x-ray of 08/19/2017 FINDINGS: There is perhaps slightly more opacity at the left lung base which may  indicate an increase in left pleural effusion and atelectasis, although pneumonia cannot be excluded. No right pleural effusion is seen. Mild cardiomegaly is stable. Tracheostomy remains. IMPRESSION: 1. Slight increase in opacity at the left lung base may indicate increasing left pleural effusion and atelectasis. Cannot exclude pneumonia. 2. Stable mild cardiomegaly. Electronically Signed   By: Dwyane Dee M.D.   On: 08/28/2017 08:06   US  Thoracentesis Asp Pleural Space W/img Guide  Result Date: 08/28/2017 INDICATION: Chronic ventilator dependence. Left pleural effusion. Request for diagnostic thoracentesis. EXAM: ULTRASOUND GUIDED LEFT THORACENTESIS MEDICATIONS: 10 mL 2% lidocaine COMPLICATIONS: None immediate. PROCEDURE: An ultrasound guided thoracentesis was thoroughly discussed with the patient and questions answered. The benefits, risks, alternatives and complications were also discussed. The patient understands and wishes to proceed with the procedure. Written consent was obtained. Ultrasound was performed to localize and mark an adequate pocket of fluid in the left chest. The area was then prepped and draped in the normal sterile fashion. 2% Lidocaine was used for local anesthesia. Under ultrasound guidance a 6 Fr Safe-T-Centesis catheter was introduced. Thoracentesis was performed. The catheter was removed and a dressing applied. FINDINGS: A total of approximately 140 mL of serosanguinous fluid was removed. Samples were sent to the laboratory as requested by the clinical team. IMPRESSION: Successful ultrasound guided left thoracentesis yielding 140 mL of pleural fluid. Read by Lynnette Caffey, PA-C Electronically Signed   By: Gilmer Mor D.O.   On: 08/28/2017 14:27    Assessment/Plan Active Problems:   Acute on chronic respiratory failure with hypoxia (HCC)   Severe sepsis with septic shock (HCC)   Healthcare-associated pneumonia   Acute metabolic encephalopathy   Alcohol withdrawal seizure with complication, with unspecified complication (HCC)   Severe mitral valve regurgitation   COPD (chronic obstructive pulmonary disease) (HCC)   1. Acute on chronic respiratory failure with hypoxia we will continue with full support on the ventilator.  Respiratory therapy will assess the spontaneous breathing index and try to resume weaning.  The follow-up chest film showed no pneumothorax had very small amount of fluid  removed. 2. Healthcare associated pneumonia treated with antibiotics we will continue present management. 3. Severe sepsis with shock hemodynamically stable 4. Tachycardia clinically improved 5. Acute metabolic encephalopathy she remains unresponsive. 6. Alcohol withdrawal no active seizures noted. 7. Severe mitral regurgitation at baseline 8. COPD severe disease we will continue to monitor   I have personally seen and evaluated the patient, evaluated laboratory and imaging results, formulated the assessment and plan and placed orders. The Patient requires high complexity decision making for assessment and support.  Case was discussed on Rounds with the Respiratory Therapy Staff   Yevonne Pax, MD [facility] Pulmonary Critical Care Medicine Sleep Medicine

## 2017-08-30 ENCOUNTER — Encounter (HOSPITAL_BASED_OUTPATIENT_CLINIC_OR_DEPARTMENT_OTHER): Payer: Medicare Other

## 2017-08-30 ENCOUNTER — Other Ambulatory Visit (HOSPITAL_COMMUNITY): Payer: Medicare Other

## 2017-08-30 DIAGNOSIS — R569 Unspecified convulsions: Secondary | ICD-10-CM | POA: Diagnosis not present

## 2017-08-30 DIAGNOSIS — F10239 Alcohol dependence with withdrawal, unspecified: Secondary | ICD-10-CM | POA: Diagnosis not present

## 2017-08-30 DIAGNOSIS — J90 Pleural effusion, not elsewhere classified: Secondary | ICD-10-CM | POA: Diagnosis not present

## 2017-08-30 DIAGNOSIS — M7989 Other specified soft tissue disorders: Secondary | ICD-10-CM

## 2017-08-30 DIAGNOSIS — J9621 Acute and chronic respiratory failure with hypoxia: Secondary | ICD-10-CM | POA: Diagnosis not present

## 2017-08-30 DIAGNOSIS — J189 Pneumonia, unspecified organism: Secondary | ICD-10-CM | POA: Diagnosis not present

## 2017-08-30 DIAGNOSIS — I340 Nonrheumatic mitral (valve) insufficiency: Secondary | ICD-10-CM | POA: Diagnosis not present

## 2017-08-30 DIAGNOSIS — J449 Chronic obstructive pulmonary disease, unspecified: Secondary | ICD-10-CM | POA: Diagnosis not present

## 2017-08-30 DIAGNOSIS — G9341 Metabolic encephalopathy: Secondary | ICD-10-CM | POA: Diagnosis not present

## 2017-08-30 LAB — CBC
HCT: 25.7 % — ABNORMAL LOW (ref 36.0–46.0)
Hemoglobin: 7.1 g/dL — ABNORMAL LOW (ref 12.0–15.0)
MCH: 25.6 pg — ABNORMAL LOW (ref 26.0–34.0)
MCHC: 27.6 g/dL — ABNORMAL LOW (ref 30.0–36.0)
MCV: 92.8 fL (ref 78.0–100.0)
PLATELETS: 385 10*3/uL (ref 150–400)
RBC: 2.77 MIL/uL — AB (ref 3.87–5.11)
RDW: 17.7 % — ABNORMAL HIGH (ref 11.5–15.5)
WBC: 19.4 10*3/uL — AB (ref 4.0–10.5)

## 2017-08-30 LAB — ABO/RH: ABO/RH(D): O POS

## 2017-08-30 LAB — BASIC METABOLIC PANEL
ANION GAP: 10 (ref 5–15)
BUN: 40 mg/dL — AB (ref 8–23)
CO2: 26 mmol/L (ref 22–32)
Calcium: 8.2 mg/dL — ABNORMAL LOW (ref 8.9–10.3)
Chloride: 112 mmol/L — ABNORMAL HIGH (ref 98–111)
Creatinine, Ser: 0.91 mg/dL (ref 0.44–1.00)
GFR calc Af Amer: >60 mL/min (ref >60)
Glucose, Bld: 97 mg/dL (ref 70–99)
POTASSIUM: 3.5 mmol/L (ref 3.5–5.1)
SODIUM: 148 mmol/L — AB (ref 135–145)

## 2017-08-30 LAB — URINE CULTURE
CULTURE: NO GROWTH
SPECIAL REQUESTS: NORMAL

## 2017-08-30 LAB — LACTIC ACID, PLASMA
LACTIC ACID, VENOUS: 2.1 mmol/L — AB (ref 0.5–1.9)
LACTIC ACID, VENOUS: 2.7 mmol/L — AB (ref 0.5–1.9)
Lactic Acid, Venous: 1.9 mmol/L (ref 0.5–1.9)

## 2017-08-30 LAB — PREPARE RBC (CROSSMATCH)

## 2017-08-30 LAB — HEMOGLOBIN AND HEMATOCRIT, BLOOD
HCT: 23.8 % — ABNORMAL LOW (ref 36.0–46.0)
HEMOGLOBIN: 6.4 g/dL — AB (ref 12.0–15.0)

## 2017-08-30 NOTE — Progress Notes (Signed)
Pulmonary Critical Care Medicine [facility] [place]   PULMONARY SERVICE  PROGRESS NOTE  Date of Service: 08/30/2017  Tiffany Gordon  [id number]  DOB: [birth year]   DOA: 08/02/2017  Referring Physician: Carron Curie, MD  HPI: Tiffany Gordon is a 67 y.o. female seen for follow up of Acute on Chronic Respiratory Failure.  She is still on the ventilator.  Has had increased agitation noted.  Right now is on pressure assist-control mode has not been able tolerate any weaning.  Currently is requiring 30% oxygen.  The heart rate is doing little bit better than it was  Medications: Reviewed on Rounds  Physical Exam:  Vitals:  Temperature 97.0 degrees pulse 70 respiratory 31 blood pressure 107/76 saturations 96%  Ventilator Settings  Currently on pressure assist-control mode on 30% oxygen.  Tidal volume is 315 peep 5  . General: Comfortable at this time . Eyes: Grossly normal lids, irises & conjunctiva . ENT: grossly tongue is normal . Neck: no obvious mass . Cardiovascular: S1 S2 normal no gallop . Respiratory:  No rhonchi or rales are noted at this time . Abdomen: soft . Skin: no rash seen on limited exam . Musculoskeletal: not rigid . Psychiatric:unable to assess . Neurologic: no seizure no involuntary movements         Lab Data:   Basic Metabolic Panel: Recent Labs  Lab 08/25/17 0624 08/27/17 0711 08/28/17 0847 08/29/17 0225 08/30/17 0507  NA 143 148* 150* 147* 148*  K 3.4* 3.8 4.8 4.8 3.5  CL 96* 99 102 105 112*  CO2 35* 35* 33* 28 26  GLUCOSE 113* 123* 138* 89 97  BUN 46* 54* 57* 63* 40*  CREATININE 0.85 0.88 0.97 1.09* 0.91  CALCIUM 9.8 9.9 9.5 9.1 8.2*  MG 2.4 2.3 2.2 2.3  --   PHOS 5.6* 6.0* 5.9* 5.6*  --     Liver Function Tests: No results for input(s): AST, ALT, ALKPHOS, BILITOT, PROT, ALBUMIN in the last 168 hours. No results for input(s): LIPASE, AMYLASE in the last 168 hours. No results for input(s): AMMONIA in the last 168  hours.  CBC: Recent Labs  Lab 08/25/17 0624 08/27/17 0711 08/28/17 0847 08/29/17 0225 08/30/17 0507 08/30/17 1438  WBC 18.3* 18.9* 21.1* 21.1* 19.4*  --   HGB 8.2* 8.6* 8.4* 8.0* 7.1* 6.4*  HCT 29.1* 31.1* 31.0* 29.6* 25.7* 23.8*  MCV 93.9 93.7 94.8 96.4 92.8  --   PLT 551* 640* 705* 516* 385  --     Cardiac Enzymes: No results for input(s): CKTOTAL, CKMB, CKMBINDEX, TROPONINI in the last 168 hours.  BNP (last 3 results) Recent Labs    08/05/17 1004  BNP 940.5*    ProBNP (last 3 results) No results for input(s): PROBNP in the last 8760 hours.  Radiological Exams: Dg Chest Port 1 View  Result Date: 08/30/2017 CLINICAL DATA:  Pleural effusion EXAM: PORTABLE CHEST 1 VIEW COMPARISON:  August 28, 2017 chest radiograph and chest CT FINDINGS: Tracheostomy catheter tip is 4.2 cm above the carina. No pneumothorax. There is airspace consolidation in the left lower lobe with small left pleural effusion, slightly increased from 2 days prior. There is a small right pleural effusion. There is underlying interstitial fibrosis. Heart is upper normal in size with pulmonary vascularity normal. There is aortic atherosclerosis. No adenopathy. No bone lesions. IMPRESSION: Underlying parenchymal fibrosis. Small left pleural effusion with consolidation in a portion of the left lower lobe, new from recent study. Rather minimal right pleural effusion  evident. Stable cardiac silhouette. There is aortic atherosclerosis. Tracheostomy as described without pneumothorax. Aortic Atherosclerosis (ICD10-I70.0). Electronically Signed   By: Bretta BangWilliam  Woodruff III M.D.   On: 08/30/2017 09:01    Assessment/Plan Active Problems:   Acute on chronic respiratory failure with hypoxia (HCC)   Severe sepsis with septic shock (HCC)   Healthcare-associated pneumonia   Acute metabolic encephalopathy   Alcohol withdrawal seizure with complication, with unspecified complication (HCC)   Severe mitral valve regurgitation    COPD (chronic obstructive pulmonary disease) (HCC)   1.  acute on chronic Respiratory failure with hypoxia will continue with full support reassess the RSBI morning.  Continue with pulmonary toilet supportive care 2. Severe sepsis with shock hemodynamically stable continue with present management.   3. Healthcare associated pneumonia treated with antibiotics we will follow  4. Acute metabolic encephalopathy stable  5. Alcohol withdrawal no active seizures 6. Severe mitral regurgitation continue with present management   I have personally seen and evaluated the patient, evaluated laboratory and imaging results, formulated the assessment and plan and placed orders. The Patient requires high complexity decision making for assessment and support.  Case was discussed on Rounds with the Respiratory Therapy Staff  Tiffany PaxSaadat A Renatta Shrieves, MD [facility] Pulmonary Critical Care Medicine Sleep Medicine

## 2017-08-30 NOTE — Progress Notes (Signed)
*  Preliminary Results* Left upper extremity venous duplex completed. Visualized veins of the left upper extremity are negative for deep vein thrombosis. There is evidence of superficial vein thrombosis involving the left basilic vein.  Preliminary results discussed with Mindi JunkerMarsha, RN.  08/30/2017 1:59 PM  Gertie FeyMichelle Jaliah Foody, BS, RVT, RDCS, RDMS

## 2017-08-31 DIAGNOSIS — J9621 Acute and chronic respiratory failure with hypoxia: Secondary | ICD-10-CM | POA: Diagnosis not present

## 2017-08-31 DIAGNOSIS — J449 Chronic obstructive pulmonary disease, unspecified: Secondary | ICD-10-CM | POA: Diagnosis not present

## 2017-08-31 DIAGNOSIS — F10239 Alcohol dependence with withdrawal, unspecified: Secondary | ICD-10-CM | POA: Diagnosis not present

## 2017-08-31 DIAGNOSIS — G9341 Metabolic encephalopathy: Secondary | ICD-10-CM | POA: Diagnosis not present

## 2017-08-31 LAB — BODY FLUID CULTURE: Culture: NO GROWTH

## 2017-08-31 LAB — CBC
HEMATOCRIT: 27.3 % — AB (ref 36.0–46.0)
HEMOGLOBIN: 7.1 g/dL — AB (ref 12.0–15.0)
MCH: 25.2 pg — ABNORMAL LOW (ref 26.0–34.0)
MCHC: 26 g/dL — AB (ref 30.0–36.0)
MCV: 96.8 fL (ref 78.0–100.0)
Platelets: 444 10*3/uL — ABNORMAL HIGH (ref 150–400)
RBC: 2.82 MIL/uL — ABNORMAL LOW (ref 3.87–5.11)
RDW: 17.9 % — ABNORMAL HIGH (ref 11.5–15.5)
WBC: 16.7 10*3/uL — ABNORMAL HIGH (ref 4.0–10.5)

## 2017-08-31 NOTE — Progress Notes (Signed)
Pulmonary Critical Care Medicine [facility] [place]   PULMONARY SERVICE  PROGRESS NOTE  Date of Service: 08/31/2017  [name]  ZOX:[id number]  DOB: 1950-07-05   DOA: 08/02/2017  Referring Physician: Carron Curie, MD  HPI: Tiffany Gordon is a 67 y.o. female seen for follow up of Acute on Chronic Respiratory Failure.  Spontaneous breathing trial was checked not ready for weaning today patient will be continued on assist control  Medications: Reviewed on Rounds  Physical Exam:  Vitals: Temperature 98.0 pulse 64 respiratory rate 33 blood pressure 122/58 saturations 97%  Ventilator Settings on the ventilator at this time mode of ventilation assist control FiO2 35% tidal volume 450 PEEP 5  . General: Comfortable at this time . Eyes: Grossly normal lids, irises & conjunctiva . ENT: grossly tongue is normal . Neck: no obvious mass . Cardiovascular: S1 S2 normal no gallop . Respiratory: Scattered rhonchi no rales . Abdomen: soft . Skin: no rash seen on limited exam . Musculoskeletal: not rigid . Psychiatric:unable to assess . Neurologic: no seizure no involuntary movements         Lab Data:   Basic Metabolic Panel: Recent Labs  Lab 08/25/17 0624 08/27/17 0711 08/28/17 0847 08/29/17 0225 08/30/17 0507  NA 143 148* 150* 147* 148*  K 3.4* 3.8 4.8 4.8 3.5  CL 96* 99 102 105 112*  CO2 35* 35* 33* 28 26  GLUCOSE 113* 123* 138* 89 97  BUN 46* 54* 57* 63* 40*  CREATININE 0.85 0.88 0.97 1.09* 0.91  CALCIUM 9.8 9.9 9.5 9.1 8.2*  MG 2.4 2.3 2.2 2.3  --   PHOS 5.6* 6.0* 5.9* 5.6*  --     Liver Function Tests: No results for input(s): AST, ALT, ALKPHOS, BILITOT, PROT, ALBUMIN in the last 168 hours. No results for input(s): LIPASE, AMYLASE in the last 168 hours. No results for input(s): AMMONIA in the last 168 hours.  CBC: Recent Labs  Lab 08/27/17 0711 08/28/17 0847 08/29/17 0225 08/30/17 0507 08/30/17 1438 08/31/17 0616  WBC 18.9* 21.1*  21.1* 19.4*  --  16.7*  HGB 8.6* 8.4* 8.0* 7.1* 6.4* 7.1*  HCT 31.1* 31.0* 29.6* 25.7* 23.8* 27.3*  MCV 93.7 94.8 96.4 92.8  --  96.8  PLT 640* 705* 516* 385  --  444*    Cardiac Enzymes: No results for input(s): CKTOTAL, CKMB, CKMBINDEX, TROPONINI in the last 168 hours.  BNP (last 3 results) Recent Labs    08/05/17 1004  BNP 940.5*    ProBNP (last 3 results) No results for input(s): PROBNP in the last 8760 hours.  Radiological Exams: Dg Chest Port 1 View  Result Date: 08/30/2017 CLINICAL DATA:  Pleural effusion EXAM: PORTABLE CHEST 1 VIEW COMPARISON:  August 28, 2017 chest radiograph and chest CT FINDINGS: Tracheostomy catheter tip is 4.2 cm above the carina. No pneumothorax. There is airspace consolidation in the left lower lobe with small left pleural effusion, slightly increased from 2 days prior. There is a small right pleural effusion. There is underlying interstitial fibrosis. Heart is upper normal in size with pulmonary vascularity normal. There is aortic atherosclerosis. No adenopathy. No bone lesions. IMPRESSION: Underlying parenchymal fibrosis. Small left pleural effusion with consolidation in a portion of the left lower lobe, new from recent study. Rather minimal right pleural effusion evident. Stable cardiac silhouette. There is aortic atherosclerosis. Tracheostomy as described without pneumothorax. Aortic Atherosclerosis (ICD10-I70.0). Electronically Signed   By: Bretta Bang III M.D.   On: 08/30/2017 09:01  Assessment/Plan Active Problems:   Acute on chronic respiratory failure with hypoxia (HCC)   Severe sepsis with septic shock (HCC)   Healthcare-associated pneumonia   Acute metabolic encephalopathy   Alcohol withdrawal seizure with complication, with unspecified complication (HCC)   Severe mitral valve regurgitation   COPD (chronic obstructive pulmonary disease) (HCC)   1. Acute on chronic respiratory failure with hypoxia will be continued on full vent  support continue to check the spontaneous breathing index.  Continue pulmonary toilet supportive care. 2. Severe sepsis with shock hemodynamically stable 3. Healthcare associated pneumonia underlying pulmonary fibrosis noted on the chest x-ray difficult to discern if there is active pneumonitis we will continue with supportive care 4. Acute metabolic encephalopathy grossly unchanged 5. Alcohol withdrawal stable at this time no active withdrawal 6. Severe mitral regurg stable continue present management. 7. COPD at baseline   I have personally seen and evaluated the patient, evaluated laboratory and imaging results, formulated the assessment and plan and placed orders. The Patient requires high complexity decision making for assessment and support.  Case was discussed on Rounds with the Respiratory Therapy Staff  Yevonne PaxSaadat A Mechele Kittleson, MD [facility] Pulmonary Critical Care Medicine Sleep Medicine

## 2017-09-01 DIAGNOSIS — J449 Chronic obstructive pulmonary disease, unspecified: Secondary | ICD-10-CM | POA: Diagnosis not present

## 2017-09-01 DIAGNOSIS — F10239 Alcohol dependence with withdrawal, unspecified: Secondary | ICD-10-CM | POA: Diagnosis not present

## 2017-09-01 DIAGNOSIS — J9621 Acute and chronic respiratory failure with hypoxia: Secondary | ICD-10-CM | POA: Diagnosis not present

## 2017-09-01 DIAGNOSIS — G9341 Metabolic encephalopathy: Secondary | ICD-10-CM | POA: Diagnosis not present

## 2017-09-01 LAB — BPAM RBC
BLOOD PRODUCT EXPIRATION DATE: 201908112359
Blood Product Expiration Date: 201908112359
ISSUE DATE / TIME: 201907190632
ISSUE DATE / TIME: 201907191323
UNIT TYPE AND RH: 5100
Unit Type and Rh: 5100

## 2017-09-01 LAB — BASIC METABOLIC PANEL
ANION GAP: 10 (ref 5–15)
BUN: 30 mg/dL — AB (ref 8–23)
CALCIUM: 8.2 mg/dL — AB (ref 8.9–10.3)
CO2: 22 mmol/L (ref 22–32)
CREATININE: 0.67 mg/dL (ref 0.44–1.00)
Chloride: 113 mmol/L — ABNORMAL HIGH (ref 98–111)
GFR calc Af Amer: >60 mL/min (ref >60)
GLUCOSE: 108 mg/dL — AB (ref 70–99)
Potassium: 3.2 mmol/L — ABNORMAL LOW (ref 3.5–5.1)
Sodium: 145 mmol/L (ref 135–145)

## 2017-09-01 LAB — TYPE AND SCREEN
ABO/RH(D): O POS
Antibody Screen: NEGATIVE
UNIT DIVISION: 0
Unit division: 0

## 2017-09-01 LAB — CBC
HEMATOCRIT: 32.9 % — AB (ref 36.0–46.0)
Hemoglobin: 9.9 g/dL — ABNORMAL LOW (ref 12.0–15.0)
MCH: 27.6 pg (ref 26.0–34.0)
MCHC: 30.1 g/dL (ref 30.0–36.0)
MCV: 91.6 fL (ref 78.0–100.0)
PLATELETS: 305 10*3/uL (ref 150–400)
RBC: 3.59 MIL/uL — ABNORMAL LOW (ref 3.87–5.11)
RDW: 17 % — AB (ref 11.5–15.5)
WBC: 12 10*3/uL — ABNORMAL HIGH (ref 4.0–10.5)

## 2017-09-01 LAB — LACTIC ACID, PLASMA: Lactic Acid, Venous: 2 mmol/L (ref 0.5–1.9)

## 2017-09-01 LAB — MAGNESIUM: Magnesium: 1.7 mg/dL (ref 1.7–2.4)

## 2017-09-01 NOTE — Progress Notes (Signed)
[facility]   PULMONARY SERVICE  PROGRESS NOTE  Date of Service: 09/01/2017  Tiffany NailerDeborah Parks Doering  LOV:[id number]  DOB: 11/23/1950   DOA: 08/02/2017  Referring Physician: Carron CurieAli Hijazi, MD  HPI: Tiffany Gordon is a 67 y.o. female seen for follow up of Acute on Chronic Respiratory Failure.  Patient is on assist control comfortable without distress.  Currently is on 40% FiO2  Medications: Reviewed on Rounds  Physical Exam:  Vitals: Temperature 97.5 pulse 62 respiratory rate 30 blood pressure 105/53 saturations 98%  Ventilator Settings mode of ventilation assist control FiO2 40% tidal volume 432 PEEP 5  . General: Comfortable at this time . Eyes: Grossly normal lids, irises & conjunctiva . ENT: grossly tongue is normal . Neck: no obvious mass . Cardiovascular: S1 S2 normal no gallop . Respiratory: No rhonchi or rales are noted . Abdomen: soft . Skin: no rash seen on limited exam . Musculoskeletal: not rigid . Psychiatric:unable to assess . Neurologic: no seizure no involuntary movements         Lab Data:   Basic Metabolic Panel: Recent Labs  Lab 08/27/17 0711 08/28/17 0847 08/29/17 0225 08/30/17 0507 09/01/17 0659  NA 148* 150* 147* 148* 145  K 3.8 4.8 4.8 3.5 3.2*  CL 99 102 105 112* 113*  CO2 35* 33* 28 26 22   GLUCOSE 123* 138* 89 97 108*  BUN 54* 57* 63* 40* 30*  CREATININE 0.88 0.97 1.09* 0.91 0.67  CALCIUM 9.9 9.5 9.1 8.2* 8.2*  MG 2.3 2.2 2.3  --  1.7  PHOS 6.0* 5.9* 5.6*  --   --     Liver Function Tests: No results for input(s): AST, ALT, ALKPHOS, BILITOT, PROT, ALBUMIN in the last 168 hours. No results for input(s): LIPASE, AMYLASE in the last 168 hours. No results for input(s): AMMONIA in the last 168 hours.  CBC: Recent Labs  Lab 08/28/17 0847 08/29/17 0225 08/30/17 0507 08/30/17 1438 08/31/17 0616 09/01/17 0659  WBC 21.1* 21.1* 19.4*  --  16.7* 12.0*  HGB 8.4* 8.0* 7.1* 6.4* 7.1*  9.9*  HCT 31.0* 29.6* 25.7* 23.8* 27.3* 32.9*  MCV 94.8 96.4 92.8  --  96.8 91.6  PLT 705* 516* 385  --  444* 305    Cardiac Enzymes: No results for input(s): CKTOTAL, CKMB, CKMBINDEX, TROPONINI in the last 168 hours.  BNP (last 3 results) Recent Labs    08/05/17 1004  BNP 940.5*    ProBNP (last 3 results) No results for input(s): PROBNP in the last 8760 hours.  Radiological Exams: No results found.  Assessment/Plan Active Problems:   Acute on chronic respiratory failure with hypoxia (HCC)   Severe sepsis with septic shock (HCC)   Healthcare-associated pneumonia   Acute metabolic encephalopathy   Alcohol withdrawal seizure with complication, with unspecified complication (HCC)   Severe mitral valve regurgitation   COPD (chronic obstructive pulmonary disease) (HCC)   1. Acute on chronic respiratory failure with hypoxia continue with full support the patient spontaneous breathing index has been poor not able to wean continue to follow. 2. Healthcare associated pneumonia treated with antibiotics 3. Severe sepsis no hemodynamic instability at this time 4. Acute metabolic encephalopathy unchanged 5. Severe mitral regurg stable 6. COPD at baseline 7. Alcohol withdrawal no active seizures   I have personally seen and evaluated the patient, evaluated laboratory and imaging results, formulated the assessment and plan and placed orders. The Patient requires high complexity decision making for assessment and  support.  Case was discussed on Rounds with the Respiratory Therapy Staff  Allyne Gee, MD [facility] Pulmonary Critical Care Medicine Sleep Medicine

## 2017-09-02 DIAGNOSIS — J9621 Acute and chronic respiratory failure with hypoxia: Secondary | ICD-10-CM | POA: Diagnosis not present

## 2017-09-02 DIAGNOSIS — J449 Chronic obstructive pulmonary disease, unspecified: Secondary | ICD-10-CM | POA: Diagnosis not present

## 2017-09-02 DIAGNOSIS — G9341 Metabolic encephalopathy: Secondary | ICD-10-CM | POA: Diagnosis not present

## 2017-09-02 DIAGNOSIS — F10239 Alcohol dependence with withdrawal, unspecified: Secondary | ICD-10-CM | POA: Diagnosis not present

## 2017-09-02 LAB — CULTURE, BLOOD (ROUTINE X 2)
CULTURE: NO GROWTH
Culture: NO GROWTH
SPECIAL REQUESTS: ADEQUATE
Special Requests: ADEQUATE

## 2017-09-02 LAB — POTASSIUM: POTASSIUM: 4.1 mmol/L (ref 3.5–5.1)

## 2017-09-02 LAB — MAGNESIUM: Magnesium: 1.9 mg/dL (ref 1.7–2.4)

## 2017-09-02 LAB — VANCOMYCIN, TROUGH: VANCOMYCIN TR: 36 ug/mL — AB (ref 15–20)

## 2017-09-02 NOTE — Progress Notes (Signed)
[facility]   PULMONARY SERVICE  PROGRESS NOTE  Date of Service: 09/02/2017  Tiffany NailerDeborah Parks Gordon  WUJ:[id number]  DOB: 06/08/1950   DOA: 08/02/2017  Referring Physician: Carron CurieAli Hijazi, MD  HPI: Tiffany Gordon is a 67 y.o. female seen for follow up of Acute on Chronic Respiratory Failure.  Patient is comfortable without distress remains on assist control mode currently is on 40% oxygen  Medications: Reviewed on Rounds  Physical Exam:  Vitals: Temperature 99.8 pulse 65 respiratory rate 25 blood pressure 123/67 saturations 98%  Ventilator Settings mode of ventilation assist control FiO2 40% tidal volume 401 PEEP 5  . General: Comfortable at this time . Eyes: Grossly normal lids, irises & conjunctiva . ENT: grossly tongue is normal . Neck: no obvious mass . Cardiovascular: S1 S2 normal no gallop . Respiratory: No rhonchi or rales . Abdomen: soft . Skin: no rash seen on limited exam . Musculoskeletal: not rigid . Psychiatric:unable to assess . Neurologic: no seizure no involuntary movements         Lab Data:   Basic Metabolic Panel: Recent Labs  Lab 08/27/17 0711 08/28/17 0847 08/29/17 0225 08/30/17 0507 09/01/17 0659 09/02/17 0753  NA 148* 150* 147* 148* 145  --   K 3.8 4.8 4.8 3.5 3.2* 4.1  CL 99 102 105 112* 113*  --   CO2 35* 33* 28 26 22   --   GLUCOSE 123* 138* 89 97 108*  --   BUN 54* 57* 63* 40* 30*  --   CREATININE 0.88 0.97 1.09* 0.91 0.67  --   CALCIUM 9.9 9.5 9.1 8.2* 8.2*  --   MG 2.3 2.2 2.3  --  1.7 1.9  PHOS 6.0* 5.9* 5.6*  --   --   --     Liver Function Tests: No results for input(s): AST, ALT, ALKPHOS, BILITOT, PROT, ALBUMIN in the last 168 hours. No results for input(s): LIPASE, AMYLASE in the last 168 hours. No results for input(s): AMMONIA in the last 168 hours.  CBC: Recent Labs  Lab 08/28/17 0847 08/29/17 0225 08/30/17 0507 08/30/17 1438 08/31/17 0616 09/01/17 0659  WBC  21.1* 21.1* 19.4*  --  16.7* 12.0*  HGB 8.4* 8.0* 7.1* 6.4* 7.1* 9.9*  HCT 31.0* 29.6* 25.7* 23.8* 27.3* 32.9*  MCV 94.8 96.4 92.8  --  96.8 91.6  PLT 705* 516* 385  --  444* 305    Cardiac Enzymes: No results for input(s): CKTOTAL, CKMB, CKMBINDEX, TROPONINI in the last 168 hours.  BNP (last 3 results) Recent Labs    08/05/17 1004  BNP 940.5*    ProBNP (last 3 results) No results for input(s): PROBNP in the last 8760 hours.  Radiological Exams: No results found.  Assessment/Plan Active Problems:   Acute on chronic respiratory failure with hypoxia (HCC)   Severe sepsis with septic shock (HCC)   Healthcare-associated pneumonia   Acute metabolic encephalopathy   Alcohol withdrawal seizure with complication, with unspecified complication (HCC)   Severe mitral valve regurgitation   COPD (chronic obstructive pulmonary disease) (HCC)   1. Acute on chronic respiratory failure with hypoxia continue with full support on assist control.  Continue pulmonary toilet secretion management.  Patient's going to have the spontaneous breathing index checked again to see if she is able to wean so far she is been failing 2. Healthcare associated pneumonia treated with antibiotics we will continue with supportive care 3. Severe sepsis with shock hemodynamically stable we will continue  to monitor 4. Alcohol withdrawal no active seizures 5. COPD severe disease we will follow 6. Severe mitral regurg at baseline   I have personally seen and evaluated the patient, evaluated laboratory and imaging results, formulated the assessment and plan and placed orders. The Patient requires high complexity decision making for assessment and support.  Case was discussed on Rounds with the Respiratory Therapy Staff  Yevonne Pax, MD [facility] Pulmonary Critical Care Medicine Sleep Medicine

## 2017-09-03 DIAGNOSIS — F10239 Alcohol dependence with withdrawal, unspecified: Secondary | ICD-10-CM | POA: Diagnosis not present

## 2017-09-03 DIAGNOSIS — R569 Unspecified convulsions: Secondary | ICD-10-CM | POA: Diagnosis not present

## 2017-09-03 DIAGNOSIS — J189 Pneumonia, unspecified organism: Secondary | ICD-10-CM | POA: Diagnosis not present

## 2017-09-03 DIAGNOSIS — I340 Nonrheumatic mitral (valve) insufficiency: Secondary | ICD-10-CM | POA: Diagnosis not present

## 2017-09-03 DIAGNOSIS — J9621 Acute and chronic respiratory failure with hypoxia: Secondary | ICD-10-CM | POA: Diagnosis not present

## 2017-09-03 DIAGNOSIS — J90 Pleural effusion, not elsewhere classified: Secondary | ICD-10-CM | POA: Diagnosis not present

## 2017-09-03 DIAGNOSIS — J449 Chronic obstructive pulmonary disease, unspecified: Secondary | ICD-10-CM | POA: Diagnosis not present

## 2017-09-03 DIAGNOSIS — G9341 Metabolic encephalopathy: Secondary | ICD-10-CM | POA: Diagnosis not present

## 2017-09-03 LAB — BASIC METABOLIC PANEL
Anion gap: 8 (ref 5–15)
BUN: 18 mg/dL (ref 8–23)
CO2: 23 mmol/L (ref 22–32)
Calcium: 8.2 mg/dL — ABNORMAL LOW (ref 8.9–10.3)
Chloride: 106 mmol/L (ref 98–111)
Creatinine, Ser: 0.51 mg/dL (ref 0.44–1.00)
GFR calc Af Amer: >60 mL/min (ref >60)
GFR calc non Af Amer: >60 mL/min (ref >60)
GLUCOSE: 114 mg/dL — AB (ref 70–99)
Potassium: 4.5 mmol/L (ref 3.5–5.1)
Sodium: 137 mmol/L (ref 135–145)

## 2017-09-03 LAB — CBC
HEMATOCRIT: 33.5 % — AB (ref 36.0–46.0)
Hemoglobin: 9.9 g/dL — ABNORMAL LOW (ref 12.0–15.0)
MCH: 27.7 pg (ref 26.0–34.0)
MCHC: 29.6 g/dL — AB (ref 30.0–36.0)
MCV: 93.8 fL (ref 78.0–100.0)
Platelets: 247 10*3/uL (ref 150–400)
RBC: 3.57 MIL/uL — ABNORMAL LOW (ref 3.87–5.11)
RDW: 18.8 % — AB (ref 11.5–15.5)
WBC: 14.9 10*3/uL — ABNORMAL HIGH (ref 4.0–10.5)

## 2017-09-03 LAB — LACTIC ACID, PLASMA: Lactic Acid, Venous: 1.4 mmol/L (ref 0.5–1.9)

## 2017-09-03 LAB — MAGNESIUM: Magnesium: 1.6 mg/dL — ABNORMAL LOW (ref 1.7–2.4)

## 2017-09-03 LAB — VANCOMYCIN, TROUGH: Vancomycin Tr: 32 ug/mL (ref 15–20)

## 2017-09-03 NOTE — Progress Notes (Signed)
Pulmonary Critical Care Medicine [facility] [place]   PULMONARY SERVICE  PROGRESS NOTE  Date of Service: 09/03/2017  Tiffany Gordon  ZOX:[id number]  DOB: 31-Jan-1951   DOA: 08/02/2017  Referring Physician: Carron Curie, MD  HPI: Tiffany Gordon is a 67 y.o. female seen for follow up of Acute on Chronic Respiratory Failure.  She is comfortable without distress.  She was on assist control mode.  Respiratory therapy reports periods of tachypnea which would be excepted since the patient does have evidence of pulmonary fibrosis.  I think that we should be able to tolerate a higher respiratory rate so therefore I have asked respiratory therapy to proceed with weaning.  We will of course monitor her very closely for any evidence of decompensation or any hemodynamic instability.  Medications: Reviewed on Rounds  Physical Exam:  Vitals: Temperature 98.0 pulse 82 respiratory rate 30 blood pressure 101/59 saturations 100%  Ventilator Settings mode of ventilation right now was assist control FiO2 35% PEEP 5 tidal volume 450  . General: Comfortable at this time . Eyes: Grossly normal lids, irises & conjunctiva . ENT: grossly tongue is normal . Neck: no obvious mass . Cardiovascular: S1 S2 normal no gallop . Respiratory: Coarse breath sounds noted no rhonchi . Abdomen: soft . Skin: no rash seen on limited exam . Musculoskeletal: not rigid . Psychiatric:unable to assess . Neurologic: no seizure no involuntary movements         Lab Data:   Basic Metabolic Panel: Recent Labs  Lab 08/28/17 0847 08/29/17 0225 08/30/17 0507 09/01/17 0659 09/02/17 0753 09/03/17 0815  NA 150* 147* 148* 145  --  137  K 4.8 4.8 3.5 3.2* 4.1 4.5  CL 102 105 112* 113*  --  106  CO2 33* 28 26 22   --  23  GLUCOSE 138* 89 97 108*  --  114*  BUN 57* 63* 40* 30*  --  18  CREATININE 0.97 1.09* 0.91 0.67  --  0.51  CALCIUM 9.5 9.1 8.2* 8.2*  --  8.2*  MG 2.2 2.3  --  1.7 1.9 1.6*  PHOS 5.9*  5.6*  --   --   --   --     Liver Function Tests: No results for input(s): AST, ALT, ALKPHOS, BILITOT, PROT, ALBUMIN in the last 168 hours. No results for input(s): LIPASE, AMYLASE in the last 168 hours. No results for input(s): AMMONIA in the last 168 hours.  CBC: Recent Labs  Lab 08/29/17 0225 08/30/17 0507 08/30/17 1438 08/31/17 0616 09/01/17 0659 09/03/17 0815  WBC 21.1* 19.4*  --  16.7* 12.0* 14.9*  HGB 8.0* 7.1* 6.4* 7.1* 9.9* 9.9*  HCT 29.6* 25.7* 23.8* 27.3* 32.9* 33.5*  MCV 96.4 92.8  --  96.8 91.6 93.8  PLT 516* 385  --  444* 305 247    Cardiac Enzymes: No results for input(s): CKTOTAL, CKMB, CKMBINDEX, TROPONINI in the last 168 hours.  BNP (last 3 results) Recent Labs    08/05/17 1004  BNP 940.5*    ProBNP (last 3 results) No results for input(s): PROBNP in the last 8760 hours.  Radiological Exams: No results found.  Assessment/Plan Active Problems:   Acute on chronic respiratory failure with hypoxia (HCC)   Severe sepsis with septic shock (HCC)   Healthcare-associated pneumonia   Acute metabolic encephalopathy   Alcohol withdrawal seizure with complication, with unspecified complication (HCC)   Severe mitral valve regurgitation   COPD (chronic obstructive pulmonary disease) (HCC)   1. Acute  on chronic respiratory failure with hypoxia we will continue with full supportive care patient is on the ventilator we will try to start weaning her once again. 2. Severe sepsis with shock resolved 3. Healthcare associated pneumonia treated 4. Acute metabolic encephalopathy resolved 5. Alcohol withdrawal no active seizures 6. Severe mitral regurg stable 7. COPD advanced disease we will monitor   I have personally seen and evaluated the patient, evaluated laboratory and imaging results, formulated the assessment and plan and placed orders. The Patient requires high complexity decision making for assessment and support.  Case was discussed on Rounds with the  Respiratory Therapy Staff  Yevonne PaxSaadat A Augustine Leverette, MD [facility] Pulmonary Critical Care Medicine Sleep Medicine

## 2017-09-04 ENCOUNTER — Ambulatory Visit (HOSPITAL_COMMUNITY)
Admission: AD | Admit: 2017-09-04 | Discharge: 2017-09-04 | Disposition: A | Discharge status: Home / Self Care | Payer: Medicare Other | Source: Other Acute Inpatient Hospital | Attending: Internal Medicine | Admitting: Internal Medicine

## 2017-09-04 ENCOUNTER — Other Ambulatory Visit (HOSPITAL_COMMUNITY): Payer: Medicare Other

## 2017-09-04 DIAGNOSIS — F10239 Alcohol dependence with withdrawal, unspecified: Secondary | ICD-10-CM | POA: Diagnosis not present

## 2017-09-04 DIAGNOSIS — J9621 Acute and chronic respiratory failure with hypoxia: Secondary | ICD-10-CM | POA: Diagnosis not present

## 2017-09-04 DIAGNOSIS — J9690 Respiratory failure, unspecified, unspecified whether with hypoxia or hypercapnia: Secondary | ICD-10-CM | POA: Insufficient documentation

## 2017-09-04 DIAGNOSIS — G9341 Metabolic encephalopathy: Secondary | ICD-10-CM | POA: Diagnosis not present

## 2017-09-04 DIAGNOSIS — J449 Chronic obstructive pulmonary disease, unspecified: Secondary | ICD-10-CM | POA: Diagnosis not present

## 2017-09-04 LAB — CBC
HEMATOCRIT: 33.1 % — AB (ref 36.0–46.0)
HEMOGLOBIN: 10 g/dL — AB (ref 12.0–15.0)
MCH: 28.2 pg (ref 26.0–34.0)
MCHC: 30.2 g/dL (ref 30.0–36.0)
MCV: 93.2 fL (ref 78.0–100.0)
Platelets: 217 10*3/uL (ref 150–400)
RBC: 3.55 MIL/uL — ABNORMAL LOW (ref 3.87–5.11)
RDW: 18.9 % — ABNORMAL HIGH (ref 11.5–15.5)
WBC: 15.4 10*3/uL — ABNORMAL HIGH (ref 4.0–10.5)

## 2017-09-04 LAB — BASIC METABOLIC PANEL
Anion gap: 10 (ref 5–15)
BUN: 17 mg/dL (ref 8–23)
CHLORIDE: 103 mmol/L (ref 98–111)
CO2: 25 mmol/L (ref 22–32)
Calcium: 8.4 mg/dL — ABNORMAL LOW (ref 8.9–10.3)
Creatinine, Ser: 0.44 mg/dL (ref 0.44–1.00)
GFR calc Af Amer: >60 mL/min (ref >60)
GFR calc non Af Amer: >60 mL/min (ref >60)
Glucose, Bld: 101 mg/dL — ABNORMAL HIGH (ref 70–99)
POTASSIUM: 4 mmol/L (ref 3.5–5.1)
Sodium: 138 mmol/L (ref 135–145)

## 2017-09-04 LAB — PHOSPHORUS: PHOSPHORUS: 3.4 mg/dL (ref 2.5–4.6)

## 2017-09-04 LAB — MAGNESIUM: MAGNESIUM: 1.6 mg/dL — AB (ref 1.7–2.4)

## 2017-09-04 LAB — BRAIN NATRIURETIC PEPTIDE: B NATRIURETIC PEPTIDE 5: 1417.4 pg/mL — AB (ref 0.0–100.0)

## 2017-09-04 NOTE — Progress Notes (Signed)
Pulmonary Critical Care Medicine [facility] [place]   PULMONARY SERVICE  PROGRESS NOTE  Date of Service: 09/04/2017  Tiffany Gordon  EAV:[id number]  DOB: [birth year]   DOA: 08/02/2017  Referring Physician: Carron Curie, MD  HPI: Tiffany Gordon is a 67 y.o. female seen for follow up of Acute on Chronic Respiratory Failure.  Patient was attempted on T collar yesterday became extremely tachypneic so therefore was placed back on the ventilator.  She was only able to do about 1 hour 45 minutes on T collar.  Medications: Reviewed on Rounds  Physical Exam:  Vitals: Temperature 98.9 pulse 75 respiratory rate 39 blood pressure 122/71 saturations 97%  Ventilator Settings mode of ventilation assist control FiO2 30% tidal volume is 582 PEEP 5  . General: Comfortable at this time . Eyes: Grossly normal lids, irises & conjunctiva . ENT: grossly tongue is normal . Neck: no obvious mass . Cardiovascular: S1 S2 normal no gallop . Respiratory: Good air entry noted bilaterally . Abdomen: soft . Skin: no rash seen on limited exam . Musculoskeletal: not rigid . Psychiatric:unable to assess . Neurologic: no seizure no involuntary movements         Lab Data:   Basic Metabolic Panel: Recent Labs  Lab 08/28/17 0847 08/29/17 0225 08/30/17 0507 09/01/17 0659 09/02/17 0753 09/03/17 0815  NA 150* 147* 148* 145  --  137  K 4.8 4.8 3.5 3.2* 4.1 4.5  CL 102 105 112* 113*  --  106  CO2 33* 28 26 22   --  23  GLUCOSE 138* 89 97 108*  --  114*  BUN 57* 63* 40* 30*  --  18  CREATININE 0.97 1.09* 0.91 0.67  --  0.51  CALCIUM 9.5 9.1 8.2* 8.2*  --  8.2*  MG 2.2 2.3  --  1.7 1.9 1.6*  PHOS 5.9* 5.6*  --   --   --   --     Liver Function Tests: No results for input(s): AST, ALT, ALKPHOS, BILITOT, PROT, ALBUMIN in the last 168 hours. No results for input(s): LIPASE, AMYLASE in the last 168 hours. No results for input(s): AMMONIA in the last 168 hours.  CBC: Recent Labs   Lab 08/30/17 0507 08/30/17 1438 08/31/17 0616 09/01/17 0659 09/03/17 0815 09/04/17 0717  WBC 19.4*  --  16.7* 12.0* 14.9* 15.4*  HGB 7.1* 6.4* 7.1* 9.9* 9.9* 10.0*  HCT 25.7* 23.8* 27.3* 32.9* 33.5* 33.1*  MCV 92.8  --  96.8 91.6 93.8 93.2  PLT 385  --  444* 305 247 217    Cardiac Enzymes: No results for input(s): CKTOTAL, CKMB, CKMBINDEX, TROPONINI in the last 168 hours.  BNP (last 3 results) Recent Labs    08/05/17 1004  BNP 940.5*    ProBNP (last 3 results) No results for input(s): PROBNP in the last 8760 hours.  Radiological Exams: Dg Chest Port 1 View  Result Date: 09/04/2017 CLINICAL DATA:  67 year old female with pulmonary fibrosis. Tracheostomy. Left pleural effusion. EXAM: PORTABLE CHEST 1 VIEW COMPARISON:  08/30/2017 and earlier. CT Abdomen and Pelvis 08/23/2017. Chest CT 08/21/2017. FINDINGS: Portable AP semi upright view at 0628 hours. Stable tracheostomy. Stable lung volumes. Diffuse coarse pulmonary interstitial opacity with confluent bilateral costophrenic angle density, greater on the left. Confluent infrahilar opacity greater on the left. Lung volumes are mildly lower and bilateral ventilation has mildly worsened since 08/28/2017. No superimposed pneumothorax or pulmonary edema. Stable cardiac size and mediastinal contours. Negative visible bowel gas pattern. No acute osseous  abnormality identified. IMPRESSION: 1. Pulmonary fibrosis with small pleural effusion(s) and nonspecific basilar opacity, greater on the left. 2. No new cardiopulmonary abnormality. Electronically Signed   By: Odessa FlemingH  Hall M.D.   On: 09/04/2017 07:12    Assessment/Plan Active Problems:   Acute on chronic respiratory failure with hypoxia (HCC)   Severe sepsis with septic shock (HCC)   Healthcare-associated pneumonia   Acute metabolic encephalopathy   Alcohol withdrawal seizure with complication, with unspecified complication (HCC)   Severe mitral valve regurgitation   COPD (chronic  obstructive pulmonary disease) (HCC)   1. Acute on chronic respiratory failure with hypoxia patient did not tolerate the T collar yesterday we will reassess again today.  If the our SBI is good then attempt T collar otherwise hold off 2. Severe sepsis with shock hemodynamically stable 3. Healthcare associated pneumonia treated with antibiotics last chest x-ray continues to show pulmonary fibrosis and nonspecific opacities otherwise no new changes were noted. 4. Metabolic encephalopathy she is still not fully awake and alert. 5. Alcohol withdrawal seizures no active seizures noted 6. Severe mitral regurg stable 7. COPD advanced disease along with pulmonary fibrosis I think more likely   I have personally seen and evaluated the patient, evaluated laboratory and imaging results, formulated the assessment and plan and placed orders. The Patient requires high complexity decision making for assessment and support.  Case was discussed on Rounds with the Respiratory Therapy Staff  Yevonne PaxSaadat A Khan, MD [facility] Pulmonary Critical Care Medicine Sleep Medicine

## 2017-10-31 ENCOUNTER — Other Ambulatory Visit: Payer: Self-pay | Admitting: Gastroenterology

## 2017-10-31 DIAGNOSIS — K863 Pseudocyst of pancreas: Secondary | ICD-10-CM

## 2017-11-14 ENCOUNTER — Other Ambulatory Visit: Payer: Self-pay

## 2017-11-14 ENCOUNTER — Inpatient Hospital Stay (HOSPITAL_COMMUNITY)
Admission: EM | Admit: 2017-11-14 | Discharge: 2017-12-14 | DRG: 870 | Disposition: E | Payer: Medicare Other | Attending: Emergency Medicine | Admitting: Emergency Medicine

## 2017-11-14 ENCOUNTER — Encounter (HOSPITAL_COMMUNITY): Payer: Self-pay | Admitting: Emergency Medicine

## 2017-11-14 ENCOUNTER — Emergency Department (HOSPITAL_COMMUNITY): Payer: Medicare Other

## 2017-11-14 DIAGNOSIS — R64 Cachexia: Secondary | ICD-10-CM | POA: Diagnosis present

## 2017-11-14 DIAGNOSIS — J189 Pneumonia, unspecified organism: Secondary | ICD-10-CM | POA: Diagnosis present

## 2017-11-14 DIAGNOSIS — Z515 Encounter for palliative care: Secondary | ICD-10-CM | POA: Diagnosis not present

## 2017-11-14 DIAGNOSIS — R6521 Severe sepsis with septic shock: Secondary | ICD-10-CM

## 2017-11-14 DIAGNOSIS — R4182 Altered mental status, unspecified: Secondary | ICD-10-CM | POA: Diagnosis present

## 2017-11-14 DIAGNOSIS — I5032 Chronic diastolic (congestive) heart failure: Secondary | ICD-10-CM | POA: Diagnosis not present

## 2017-11-14 DIAGNOSIS — F0390 Unspecified dementia without behavioral disturbance: Secondary | ICD-10-CM | POA: Diagnosis present

## 2017-11-14 DIAGNOSIS — G9341 Metabolic encephalopathy: Secondary | ICD-10-CM | POA: Diagnosis present

## 2017-11-14 DIAGNOSIS — Z931 Gastrostomy status: Secondary | ICD-10-CM

## 2017-11-14 DIAGNOSIS — J441 Chronic obstructive pulmonary disease with (acute) exacerbation: Secondary | ICD-10-CM | POA: Diagnosis present

## 2017-11-14 DIAGNOSIS — E43 Unspecified severe protein-calorie malnutrition: Secondary | ICD-10-CM | POA: Diagnosis present

## 2017-11-14 DIAGNOSIS — R627 Adult failure to thrive: Secondary | ICD-10-CM | POA: Diagnosis not present

## 2017-11-14 DIAGNOSIS — J9621 Acute and chronic respiratory failure with hypoxia: Secondary | ICD-10-CM | POA: Diagnosis present

## 2017-11-14 DIAGNOSIS — J9602 Acute respiratory failure with hypercapnia: Secondary | ICD-10-CM | POA: Diagnosis not present

## 2017-11-14 DIAGNOSIS — I130 Hypertensive heart and chronic kidney disease with heart failure and stage 1 through stage 4 chronic kidney disease, or unspecified chronic kidney disease: Secondary | ICD-10-CM | POA: Diagnosis present

## 2017-11-14 DIAGNOSIS — Z681 Body mass index (BMI) 19 or less, adult: Secondary | ICD-10-CM

## 2017-11-14 DIAGNOSIS — Z87891 Personal history of nicotine dependence: Secondary | ICD-10-CM

## 2017-11-14 DIAGNOSIS — F419 Anxiety disorder, unspecified: Secondary | ICD-10-CM | POA: Diagnosis not present

## 2017-11-14 DIAGNOSIS — J8410 Pulmonary fibrosis, unspecified: Secondary | ICD-10-CM | POA: Diagnosis present

## 2017-11-14 DIAGNOSIS — D631 Anemia in chronic kidney disease: Secondary | ICD-10-CM | POA: Diagnosis present

## 2017-11-14 DIAGNOSIS — E871 Hypo-osmolality and hyponatremia: Secondary | ICD-10-CM | POA: Diagnosis not present

## 2017-11-14 DIAGNOSIS — E870 Hyperosmolality and hypernatremia: Secondary | ICD-10-CM | POA: Diagnosis present

## 2017-11-14 DIAGNOSIS — Z9911 Dependence on respirator [ventilator] status: Secondary | ICD-10-CM

## 2017-11-14 DIAGNOSIS — N183 Chronic kidney disease, stage 3 unspecified: Secondary | ICD-10-CM | POA: Diagnosis present

## 2017-11-14 DIAGNOSIS — Z930 Tracheostomy status: Secondary | ICD-10-CM

## 2017-11-14 DIAGNOSIS — E8729 Other acidosis: Secondary | ICD-10-CM

## 2017-11-14 DIAGNOSIS — Y95 Nosocomial condition: Secondary | ICD-10-CM | POA: Diagnosis present

## 2017-11-14 DIAGNOSIS — Z66 Do not resuscitate: Secondary | ICD-10-CM | POA: Diagnosis not present

## 2017-11-14 DIAGNOSIS — Z8701 Personal history of pneumonia (recurrent): Secondary | ICD-10-CM

## 2017-11-14 DIAGNOSIS — Z7189 Other specified counseling: Secondary | ICD-10-CM | POA: Diagnosis not present

## 2017-11-14 DIAGNOSIS — J440 Chronic obstructive pulmonary disease with acute lower respiratory infection: Secondary | ICD-10-CM | POA: Diagnosis present

## 2017-11-14 DIAGNOSIS — J9622 Acute and chronic respiratory failure with hypercapnia: Secondary | ICD-10-CM | POA: Diagnosis present

## 2017-11-14 DIAGNOSIS — F10239 Alcohol dependence with withdrawal, unspecified: Secondary | ICD-10-CM

## 2017-11-14 DIAGNOSIS — N179 Acute kidney failure, unspecified: Secondary | ICD-10-CM | POA: Diagnosis present

## 2017-11-14 DIAGNOSIS — E872 Acidosis: Secondary | ICD-10-CM | POA: Diagnosis present

## 2017-11-14 DIAGNOSIS — K219 Gastro-esophageal reflux disease without esophagitis: Secondary | ICD-10-CM | POA: Diagnosis present

## 2017-11-14 DIAGNOSIS — R6520 Severe sepsis without septic shock: Secondary | ICD-10-CM | POA: Diagnosis present

## 2017-11-14 DIAGNOSIS — A419 Sepsis, unspecified organism: Secondary | ICD-10-CM | POA: Diagnosis present

## 2017-11-14 DIAGNOSIS — R569 Unspecified convulsions: Secondary | ICD-10-CM

## 2017-11-14 DIAGNOSIS — I340 Nonrheumatic mitral (valve) insufficiency: Secondary | ICD-10-CM | POA: Diagnosis present

## 2017-11-14 DIAGNOSIS — E512 Wernicke's encephalopathy: Secondary | ICD-10-CM | POA: Diagnosis not present

## 2017-11-14 DIAGNOSIS — J449 Chronic obstructive pulmonary disease, unspecified: Secondary | ICD-10-CM | POA: Diagnosis present

## 2017-11-14 LAB — CBC WITH DIFFERENTIAL/PLATELET
ABS IMMATURE GRANULOCYTES: 0.1 10*3/uL (ref 0.0–0.1)
Basophils Absolute: 0.1 10*3/uL (ref 0.0–0.1)
Basophils Relative: 1 %
Eosinophils Absolute: 0.5 10*3/uL (ref 0.0–0.7)
Eosinophils Relative: 2 %
HEMATOCRIT: 26.8 % — AB (ref 36.0–46.0)
Hemoglobin: 7.3 g/dL — ABNORMAL LOW (ref 12.0–15.0)
Immature Granulocytes: 1 %
LYMPHS ABS: 1.2 10*3/uL (ref 0.7–4.0)
Lymphocytes Relative: 5 %
MCH: 25.4 pg — ABNORMAL LOW (ref 26.0–34.0)
MCHC: 27.2 g/dL — ABNORMAL LOW (ref 30.0–36.0)
MCV: 93.4 fL (ref 78.0–100.0)
Monocytes Absolute: 1.1 10*3/uL — ABNORMAL HIGH (ref 0.1–1.0)
Monocytes Relative: 5 %
NEUTROS ABS: 19.4 10*3/uL — AB (ref 1.7–7.7)
Neutrophils Relative %: 86 %
PLATELETS: 331 10*3/uL (ref 150–400)
RBC: 2.87 MIL/uL — AB (ref 3.87–5.11)
RDW: 19.4 % — ABNORMAL HIGH (ref 11.5–15.5)
WBC: 22.4 10*3/uL — AB (ref 4.0–10.5)

## 2017-11-14 LAB — COMPREHENSIVE METABOLIC PANEL
ALT: 15 U/L (ref 0–44)
ANION GAP: 8 (ref 5–15)
AST: 32 U/L (ref 15–41)
Albumin: 1.9 g/dL — ABNORMAL LOW (ref 3.5–5.0)
Alkaline Phosphatase: 266 U/L — ABNORMAL HIGH (ref 38–126)
BUN: 76 mg/dL — ABNORMAL HIGH (ref 8–23)
CHLORIDE: 115 mmol/L — AB (ref 98–111)
CO2: 28 mmol/L (ref 22–32)
Calcium: 8.9 mg/dL (ref 8.9–10.3)
Creatinine, Ser: 1.88 mg/dL — ABNORMAL HIGH (ref 0.44–1.00)
GFR calc Af Amer: 31 mL/min — ABNORMAL LOW (ref >60)
GFR calc non Af Amer: 27 mL/min — ABNORMAL LOW (ref >60)
Glucose, Bld: 141 mg/dL — ABNORMAL HIGH (ref 70–99)
POTASSIUM: 4.4 mmol/L (ref 3.5–5.1)
SODIUM: 151 mmol/L — AB (ref 135–145)
Total Bilirubin: 0.4 mg/dL (ref 0.3–1.2)
Total Protein: 8.6 g/dL — ABNORMAL HIGH (ref 6.5–8.1)

## 2017-11-14 LAB — I-STAT ARTERIAL BLOOD GAS, ED
ACID-BASE EXCESS: 3 mmol/L — AB (ref 0.0–2.0)
Acid-Base Excess: 4 mmol/L — ABNORMAL HIGH (ref 0.0–2.0)
BICARBONATE: 30.2 mmol/L — AB (ref 20.0–28.0)
Bicarbonate: 30 mmol/L — ABNORMAL HIGH (ref 20.0–28.0)
O2 SAT: 84 %
O2 Saturation: 98 %
PCO2 ART: 69.1 mmHg — AB (ref 32.0–48.0)
PH ART: 7.25 — AB (ref 7.350–7.450)
PO2 ART: 59 mmHg — AB (ref 83.0–108.0)
Patient temperature: 99
TCO2: 32 mmol/L (ref 22–32)
TCO2: 32 mmol/L (ref 22–32)
pCO2 arterial: 52.5 mmHg — ABNORMAL HIGH (ref 32.0–48.0)
pH, Arterial: 7.366 (ref 7.350–7.450)
pO2, Arterial: 122 mmHg — ABNORMAL HIGH (ref 83.0–108.0)

## 2017-11-14 LAB — GLUCOSE, CAPILLARY
Glucose-Capillary: 93 mg/dL (ref 70–99)
Glucose-Capillary: 122 mg/dL — ABNORMAL HIGH (ref 70–99)
Glucose-Capillary: 117 mg/dL — ABNORMAL HIGH (ref 70–99)

## 2017-11-14 LAB — URINALYSIS, ROUTINE W REFLEX MICROSCOPIC
BILIRUBIN URINE: NEGATIVE
Glucose, UA: NEGATIVE mg/dL
KETONES UR: NEGATIVE mg/dL
LEUKOCYTES UA: NEGATIVE
Nitrite: NEGATIVE
PH: 5 (ref 5.0–8.0)
PROTEIN: 100 mg/dL — AB
RBC / HPF: >50 RBC/hpf — ABNORMAL HIGH (ref 0–5)
Specific Gravity, Urine: 1.017 (ref 1.005–1.030)

## 2017-11-14 LAB — BRAIN NATRIURETIC PEPTIDE: B NATRIURETIC PEPTIDE 5: 1024.1 pg/mL — AB (ref 0.0–100.0)

## 2017-11-14 LAB — PROCALCITONIN
PROCALCITONIN: 4.61 ng/mL
PROCALCITONIN: 13.5 ng/mL

## 2017-11-14 LAB — MAGNESIUM: MAGNESIUM: 2.1 mg/dL (ref 1.7–2.4)

## 2017-11-14 LAB — PHOSPHORUS: PHOSPHORUS: 4.9 mg/dL — AB (ref 2.5–4.6)

## 2017-11-14 LAB — I-STAT CG4 LACTIC ACID, ED: LACTIC ACID, VENOUS: 1.25 mmol/L (ref 0.5–1.9)

## 2017-11-14 LAB — MRSA PCR SCREENING: MRSA by PCR: NEGATIVE

## 2017-11-14 MED ORDER — DEXAMETHASONE SODIUM PHOSPHATE 10 MG/ML IJ SOLN
10.0000 mg | Freq: Every day | INTRAMUSCULAR | Status: AC
  Administered 2017-11-14 – 2017-11-16 (×3): 10 mg via INTRAVENOUS
  Filled 2017-11-14 (×3): qty 1

## 2017-11-14 MED ORDER — ALBUTEROL SULFATE (2.5 MG/3ML) 0.083% IN NEBU: 2.5000 mg | INHALATION_SOLUTION | RESPIRATORY_TRACT | Status: DC | PRN

## 2017-11-14 MED ORDER — MORPHINE SULFATE (PF) 2 MG/ML IV SOLN
2.0000 mg | Freq: Once | INTRAVENOUS | Status: AC
  Administered 2017-11-14: 2 mg via INTRAVENOUS
  Filled 2017-11-14: qty 1

## 2017-11-14 MED ORDER — ORAL CARE MOUTH RINSE
15.0000 mL | OROMUCOSAL | Status: DC
  Administered 2017-11-14 – 2017-11-19 (×42): 15 mL via OROMUCOSAL

## 2017-11-14 MED ORDER — SODIUM CHLORIDE 0.9 % IV BOLUS (SEPSIS): 500.0000 mL | Freq: Once | INTRAVENOUS | Status: DC

## 2017-11-14 MED ORDER — FAMOTIDINE 20 MG PO TABS
20.0000 mg | ORAL_TABLET | Freq: Every day | ORAL | Status: DC
  Administered 2017-11-14 – 2017-11-16 (×3): 20 mg via ORAL
  Filled 2017-11-14 (×3): qty 1

## 2017-11-14 MED ORDER — VANCOMYCIN HCL IN DEXTROSE 1-5 GM/200ML-% IV SOLN
1000.0000 mg | Freq: Once | INTRAVENOUS | Status: AC
  Administered 2017-11-14: 1000 mg via INTRAVENOUS
  Filled 2017-11-14: qty 200

## 2017-11-14 MED ORDER — FENTANYL 2500MCG IN NS 250ML (10MCG/ML) PREMIX INFUSION
0.0000 ug/h | INTRAVENOUS | Status: DC
  Administered 2017-11-14: 25 ug/h via INTRAVENOUS
  Administered 2017-11-15: 150 ug/h via INTRAVENOUS
  Administered 2017-11-16: 275 ug/h via INTRAVENOUS
  Administered 2017-11-16: 150 ug/h via INTRAVENOUS
  Administered 2017-11-17 (×2): 250 ug/h via INTRAVENOUS
  Administered 2017-11-18 – 2017-11-19 (×3): 200 ug/h via INTRAVENOUS
  Filled 2017-11-14 (×9): qty 250

## 2017-11-14 MED ORDER — PRO-STAT SUGAR FREE PO LIQD
30.0000 mL | Freq: Two times a day (BID) | ORAL | Status: DC
  Administered 2017-11-14 – 2017-11-15 (×3): 30 mL
  Filled 2017-11-14 (×3): qty 30

## 2017-11-14 MED ORDER — VANCOMYCIN HCL 500 MG IV SOLR
500.0000 mg | INTRAVENOUS | Status: DC
  Administered 2017-11-15: 500 mg via INTRAVENOUS
  Filled 2017-11-14 (×2): qty 500

## 2017-11-14 MED ORDER — ONDANSETRON HCL 4 MG/2ML IJ SOLN
4.0000 mg | Freq: Four times a day (QID) | INTRAMUSCULAR | Status: DC | PRN
  Administered 2017-11-17 (×2): 4 mg via INTRAVENOUS
  Filled 2017-11-14 (×2): qty 2

## 2017-11-14 MED ORDER — SODIUM CHLORIDE 0.9 % IV SOLN
1.0000 g | INTRAVENOUS | Status: DC
  Administered 2017-11-15 – 2017-11-19 (×5): 1 g via INTRAVENOUS
  Filled 2017-11-14 (×5): qty 1

## 2017-11-14 MED ORDER — INSULIN ASPART 100 UNIT/ML ~~LOC~~ SOLN
1.0000 [IU] | SUBCUTANEOUS | Status: DC
  Administered 2017-11-14: 1 [IU] via SUBCUTANEOUS
  Administered 2017-11-15: 2 [IU] via SUBCUTANEOUS
  Administered 2017-11-16 (×3): 1 [IU] via SUBCUTANEOUS
  Administered 2017-11-17: 3 [IU] via SUBCUTANEOUS

## 2017-11-14 MED ORDER — ACETAMINOPHEN 325 MG PO TABS
650.0000 mg | ORAL_TABLET | ORAL | Status: DC | PRN
  Administered 2017-11-14 – 2017-11-15 (×2): 650 mg via ORAL
  Filled 2017-11-14 (×2): qty 2

## 2017-11-14 MED ORDER — SODIUM CHLORIDE 0.9 % IV BOLUS (SEPSIS)
1000.0000 mL | Freq: Once | INTRAVENOUS | Status: AC
  Administered 2017-11-14: 1000 mL via INTRAVENOUS

## 2017-11-14 MED ORDER — CHLORHEXIDINE GLUCONATE 0.12% ORAL RINSE (MEDLINE KIT)
15.0000 mL | Freq: Two times a day (BID) | OROMUCOSAL | Status: DC
  Administered 2017-11-14 – 2017-11-19 (×10): 15 mL via OROMUCOSAL

## 2017-11-14 MED ORDER — IPRATROPIUM-ALBUTEROL 0.5-2.5 (3) MG/3ML IN SOLN
3.0000 mL | Freq: Four times a day (QID) | RESPIRATORY_TRACT | Status: DC
  Administered 2017-11-14 – 2017-11-17 (×12): 3 mL via RESPIRATORY_TRACT
  Filled 2017-11-14 (×10): qty 3

## 2017-11-14 MED ORDER — SODIUM CHLORIDE 0.9 % IV SOLN
INTRAVENOUS | Status: DC | PRN
  Administered 2017-11-15 – 2017-11-16 (×2): 20 mL via INTRAVENOUS

## 2017-11-14 MED ORDER — SODIUM CHLORIDE 0.9 % IV BOLUS (SEPSIS): 250.0000 mL | Freq: Once | INTRAVENOUS | Status: DC

## 2017-11-14 MED ORDER — HEPARIN SODIUM (PORCINE) 5000 UNIT/ML IJ SOLN
5000.0000 [IU] | Freq: Two times a day (BID) | INTRAMUSCULAR | Status: DC
  Administered 2017-11-14 – 2017-11-19 (×11): 5000 [IU] via SUBCUTANEOUS
  Filled 2017-11-14 (×11): qty 1

## 2017-11-14 MED ORDER — SODIUM CHLORIDE 0.9 % IV SOLN
2.0000 g | Freq: Once | INTRAVENOUS | Status: AC
  Administered 2017-11-14: 2 g via INTRAVENOUS
  Filled 2017-11-14: qty 2

## 2017-11-14 MED ORDER — VITAL HIGH PROTEIN PO LIQD
1000.0000 mL | ORAL | Status: DC
  Administered 2017-11-14: 1000 mL

## 2017-11-14 MED ORDER — FENTANYL BOLUS VIA INFUSION
25.0000 ug | INTRAVENOUS | Status: DC | PRN
  Administered 2017-11-15 – 2017-11-18 (×5): 25 ug via INTRAVENOUS
  Filled 2017-11-14: qty 25

## 2017-11-14 NOTE — ED Triage Notes (Addendum)
Pt to ED via [facility] as a transfer from [facility] care area-  Reported that pt was less responsive than normal this am, with pt being pale/hot to touch at facility.  On arrival, pt is awake, does follow commands - will squeeze hands,  Pt is trached, has PICC line left upper arm, G-tube,

## 2017-11-14 NOTE — Progress Notes (Signed)
Received pt from ED to [facility] and MRSA swab done. ON vent -

## 2017-11-14 NOTE — Progress Notes (Signed)
Patient transported to [facility] from the ED without any apparent complications.

## 2017-11-14 NOTE — ED Provider Notes (Signed)
[facility] EMERGENCY DEPARTMENT Provider Note   CSN: [id number] Arrival date & time: 11/21/2017  1053     History   Chief Complaint Chief Complaint  Patient presents with  . Code Sepsis    HPI Tiffany Gordon is a 67 y.o. female history of COPD, previous pneumonia now has trach and PEG is from [facility] here presenting with altered mental status, acidosis.  Patient apparently was alert and oriented at baseline but was noted to be altered since yesterday.  Patient also was noted to be acidotic with ABG showing 7.2 pH.  Her vent settings were changed and she was still altered so sent here for further evaluation.  Patient unable to give history.   The history is provided by the EMS personnel.   Level V caveat- AMS   Past Medical History:  Diagnosis Date  . Acute metabolic encephalopathy 08/07/2017  . Acute on chronic respiratory failure with hypoxia (HCC) 08/07/2017  . Alcohol withdrawal seizure with complication, with unspecified complication (HCC) 08/07/2017  . Anxiety disorder   . COPD (chronic obstructive pulmonary disease) (HCC)   . GERD without esophagitis   . Healthcare-associated pneumonia 08/07/2017  . History of esophagogastroduodenoscopy (EGD)   . Hypertension   . Severe mitral valve regurgitation 08/07/2017  . Severe sepsis with septic shock (HCC) 08/07/2017    Patient Active Problem List   Diagnosis Date Noted  . Acute on chronic respiratory failure with hypoxia (HCC) 08/07/2017  . Severe sepsis with septic shock (HCC) 08/07/2017  . Healthcare-associated pneumonia 08/07/2017  . Acute metabolic encephalopathy 08/07/2017  . Alcohol withdrawal seizure with complication, with unspecified complication (HCC) 08/07/2017  . Severe mitral valve regurgitation 08/07/2017  . COPD (chronic obstructive pulmonary disease) (HCC)     Past Surgical History:  Procedure Laterality Date  . COLONOSCOPY    . COLONOSCOPY WITH ESOPHAGOGASTRODUODENOSCOPY (EGD)    .  EYE SURGERY    . IR GASTROSTOMY TUBE MOD SED  08/15/2017  . TUBAL LIGATION       OB History   None      Home Medications    Prior to Admission medications   Not on File    Family History Family History  Family history unknown: Yes    Social History Social History   Tobacco Use  . Smoking status: Former Games developer  . Smokeless tobacco: Never Used  Substance Use Topics  . Alcohol use: Not Currently  . Drug use: Not Currently     Allergies   Patient has no allergy information on record.   Review of Systems Review of Systems  Unable to perform ROS: Mental status change  All other systems reviewed and are negative.    Physical Exam Updated Vital Signs BP 126/73   Pulse (!) 120   Temp 99 F (37.2 C) (Rectal)   Resp (!) 42   Wt 52.6 kg   SpO2 95%   Physical Exam  Constitutional:  Chronically ill, contracted, on a vent with trach   HENT:  Head: Normocephalic.  Eyes: Pupils are equal, round, and reactive to light. EOM are normal.  Neck:  Trach in place on a vent   Cardiovascular: Normal rate, regular rhythm and normal heart sounds.  Pulmonary/Chest:  Crackles bilaterally   Abdominal: Soft. Bowel sounds are normal. She exhibits no distension. There is no tenderness.  Peg tube in place with no obvious surrounding erythema   Musculoskeletal: Normal range of motion. She exhibits no edema.  Stage 1 sacral decub ulcer  Neurological:  Awake, contracted. Nonverbal.   Skin: Skin is warm.  Psychiatric:  Unable   Nursing note and vitals reviewed.    ED Treatments / Results  Labs (all labs ordered are listed, but only abnormal results are displayed) Labs Reviewed  CBC WITH DIFFERENTIAL/PLATELET - Abnormal; Notable for the following components:      Result Value   WBC 22.4 (*)    RBC 2.87 (*)    Hemoglobin 7.3 (*)    HCT 26.8 (*)    MCH 25.4 (*)    MCHC 27.2 (*)    RDW 19.4 (*)    Neutro Abs 19.4 (*)    Monocytes Absolute 1.1 (*)    All other  components within normal limits  I-STAT ARTERIAL BLOOD GAS, ED - Abnormal; Notable for the following components:   pH, Arterial 7.250 (*)    pCO2 arterial 69.1 (*)    pO2, Arterial 59.0 (*)    Bicarbonate 30.2 (*)    Acid-Base Excess 3.0 (*)    All other components within normal limits  CULTURE, BLOOD (ROUTINE X 2)  CULTURE, BLOOD (ROUTINE X 2)  URINE CULTURE  COMPREHENSIVE METABOLIC PANEL  URINALYSIS, ROUTINE W REFLEX MICROSCOPIC  PROCALCITONIN  BRAIN NATRIURETIC PEPTIDE  I-STAT CG4 LACTIC ACID, ED  I-STAT CG4 LACTIC ACID, ED    EKG EKG Interpretation  Date/Time:  Wednesday December 12, 2017 11:03:22 EDT Ventricular Rate:  121 PR Interval:    QRS Duration: 84 QT Interval:  312 QTC Calculation: 443 R Axis:   98 Text Interpretation:  Sinus tachycardia Multiple premature complexes, vent & supraven Consider right atrial enlargement Probable RVH w/ secondary repol abnormality No previous ECGs available Confirmed by Richardean Canal ([id number]) on 12-12-17 11:18:51 AM   Radiology Dg Chest Port 1 View  Result Date: 2017-12-12 CLINICAL DATA:  Shortness of breath. EXAM: PORTABLE CHEST 1 VIEW COMPARISON:  Radiograph of September 04, 2017. FINDINGS: Stable cardiomegaly. Tracheostomy tube is in grossly good position. Left-sided PICC line is noted with distal tip in expected position of cavoatrial junction. No pneumothorax or pleural effusion is noted. Stable diffuse interstitial densities are noted throughout both lungs most consistent with pulmonary fibrosis. Acute superimposed edema or inflammation cannot be excluded. Atherosclerosis of thoracic aorta is noted. Old left rib fractures are noted. IMPRESSION: Stable diffuse interstitial densities are noted throughout both lungs most consistent pulmonary fibrosis. Acute superimposed edema or inflammation cannot be excluded. Interval placement of left-sided PICC line with distal tip in expected position of cavoatrial junction. Aortic Atherosclerosis  (ICD10-I70.0). Electronically Signed   By: Lupita Raider, M.D.   On: 12/12/2017 11:29    Procedures Procedures (including critical care time)  CRITICAL CARE Performed by: Richardean Canal   Total critical care time: 30 minutes  Critical care time was exclusive of separately billable procedures and treating other patients.  Critical care was necessary to treat or prevent imminent or life-threatening deterioration.  Critical care was time spent personally by me on the following activities: development of treatment plan with patient and/or surrogate as well as nursing, discussions with consultants, evaluation of patient's response to treatment, examination of patient, obtaining history from patient or surrogate, ordering and performing treatments and interventions, ordering and review of laboratory studies, ordering and review of radiographic studies, pulse oximetry and re-evaluation of patient's condition.   Medications Ordered in ED Medications  sodium chloride 0.9 % bolus 1,000 mL (1,000 mLs Intravenous New Bag/Given Dec 12, 2017 1149)    And  sodium chloride 0.9 %  bolus 500 mL (has no administration in time range)    And  sodium chloride 0.9 % bolus 250 mL (has no administration in time range)  vancomycin (VANCOCIN) IVPB 1000 mg/200 mL premix (has no administration in time range)  ceFEPIme (MAXIPIME) 2 g in sodium chloride 0.9 % 100 mL IVPB (has no administration in time range)     Initial Impression / Assessment and Plan / ED Course  I have reviewed the triage vital signs and the nursing notes.  Pertinent labs & imaging results that were available during my care of the patient were reviewed by me and considered in my medical decision making (see chart for details).     Tiffany Gordon is a 67 y.o. female here with AMS, acidosis. She has a trach and is breathing over the vent. Will adjust vent settings. Will do sepsis workup. Likely source of infection is HCAP. Will give 30 cc/kg  bolus and cefepime and vanc per HCAP protocol. Will repeat ABG and get CT head.   12:45 PM Repeat ABG showed pH 7.2. CO2 68. We increased tidal volume. WBC 22. CXR showed pneumonia. Will admit to critical care for respiratory acidosis with hypoxia, HCAP.    Final Clinical Impressions(s) / ED Diagnoses   Final diagnoses:  HCAP (healthcare-associated pneumonia)  Respiratory acidosis    ED Discharge Orders    None       Charlynne Pander, MD 11/21/2017 1245

## 2017-11-14 NOTE — Progress Notes (Signed)
Pharmacy Antibiotic Note  Tiffany Gordon is a 67 y.o. female admitted on 2017-11-15 with sepsis.     Plan: Cefepime 2 g x 1 then 1 g q24h Vanc 1 g x 1 then 500 mg q24h Monitor renal fx cx vt prn  Height: 5' (152.4 cm) Weight: 116 lb (52.6 kg) IBW/kg (Calculated) : 45.5  Temp (24hrs), Avg:99 F (37.2 C), Min:99 F (37.2 C), Max:99 F (37.2 C)  Recent Labs  Lab Nov 15, 2017 1138 Nov 15, 2017 1158  WBC 22.4*  --   CREATININE 1.88*  --   LATICACIDVEN  --  1.25    Estimated Creatinine Clearance: 21.1 mL/min (A) (by C-G formula based on SCr of 1.88 mg/dL (H)).    Not on File  Isaac Bliss, PharmD, BCPS, BCCCP Clinical Pharmacist [phone]  Please check AMION for all [facility] Pharmacy numbers  2017-11-15 1:04 PM

## 2017-11-14 NOTE — ED Notes (Signed)
Tiffany Gordon - husband --- [phone]

## 2017-11-14 NOTE — H&P (Signed)
NAME:  Tiffany Gordon, MRN:  [id number], DOB:  Oct 17, 1950, LOS: 0 ADMISSION DATE:  11/15/2017, CONSULTATION DATE:  11/16/2017 REFERRING MD:  Cheri Rous - ED [facility], CHIEF COMPLAINT:  Worsening respiratory failure.   Brief History   67 year old woman transferred from [facility] altered mental status.  She is apparently alert and oriented at baseline. Found to have acute respiratory acidosis.  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   Patient was living in the community with her husband until admission to [facility] in 06/2017 and has been at [facility] since then.  Consults: date of consult/date signed off & final recs:  PCCM 12/09/2017.  Procedures (surgical and bedside):  N/A Subjective:  Patient is non verbal.  Unclear from current documentation was present functional level is.  Objective   Blood pressure 126/73, pulse (!) 120, temperature 99 F (37.2 C), temperature source Rectal, resp. rate (!) 42, height 5' (1.524 m), weight 52.6 kg, SpO2 95 %.    Vent Mode: PRVC FiO2 (%):  [50 %] 50 % Set Rate:  [20 bmp] 20 bmp Vt Set:  [290 mL-350 mL] 350 mL PEEP:  [5 cmH20] 5 cmH20 Plateau Pressure:  [15 cmH20] 15 cmH20  No intake or output data in the 24 hours ending 12/13/2017 1334 Filed Weights   11/15/2017 1128  Weight: 52.6 kg    Examination: General: Severe cachexia. HENT: Mucus membranes dry. Tracheostomy in place with no skin breakdown. Lungs: tachypnea with diffuse rhonchi. Minimal secretions.  High Ppeak pressures. Unable to calculate PEEPi Cardiovascular: normotensive. Extremities warm, HS normal.  Abdomen: PEG tube int place Extremities: No edema. Neuro: Moves all extremities spontaneously and purposefully but not following commands and generally agitated. GU: Clear urine Foley.  Resolved Hospital Problem list    Assessment & Plan:   Acute on chronic respiratory  failure with severe hypercarbia. While there may be a component of acute infection or COPD decompensation, the CXR is not substantially different from her severely abnormal baseline. I suspect that this may represent the progression of her underlying illness.   I discussed the patient's poor long-term outcome with the patient's husband and told him that she will not recover to the point where she would be able to return home as he would have hoped, given that her underlying lung disease is progressive and ultimately fatal. She has not improved despite ample time for recovery and she suffers from the vicious cycle of malnutrition and cachexia: the latter increases her mortality and also the failure to improve with nutrition also portends a dismal prognosis.   The husband has confirmed that the patient would value quality over quantity of life and is open to further end-of-life care discussions tomorrow when son can come down from [place] where the family lives.  DNR with no escalation until then.  I will continue acute mechanical ventilation and titrate as needed to correct severe hypercarbia.  Will introduce some fentanyl for comfort to decrease WOB and decrease respiratory rate which may be compounding gas trapping. I will start antibiotics, steroids and bronchodilators.  Severe malnutrition - resume tube feeds via PEG tube.  History of HFPEF with markedly elevated BNP - trial of diuresis although examination and hypernatremia argue against intravascular volume overload.   Hypernatremia due to increased insensible losses.  Initiate additional free water flushes with feeds.  Disposition / Summary of Today's Plan 11/20/2017   Admit to ICU for  respiratory failure manage ment aimed at correcting acute component, will continue discussions regarding goals of care.    Nutritional status and diet: severe malnutrition - restart feeds Pain/Anxiety/Delirium reduction: fentanyl infusion. VAP protocol (if  indicated) initiated. DVT prophylaxis: UFH GI prophylaxis: Famotidine Hyperglycemia protocol: SSI - at high hypoglycemia risk. Mobility:Bedrest Antibiotic de-escalation: HCAP coverage  Code Status: DNR Family Communication: as documented above.  Labs and Ancillary Testing (personally reviewed)  CBC: Leukocytosis at 22.4, anemia of chronic disease HB 7.3.  Chemistry: hypernatremia at 151, creatinine 1.88, BNP 1024  ABG acute respiratory acidosis with pCO2 of 69.1  Coagulation Profile:  Cardiac Enzymes:  CBG: CBG 141  Microbiology:  PCT 4.61  Review of Systems:   Unable to perform given mental status.  Past medical history  She,  has a past medical history of Acute metabolic encephalopathy (08/07/2017), Acute on chronic respiratory failure with hypoxia (HCC) (08/07/2017), Alcohol withdrawal seizure with complication, with unspecified complication (HCC) (08/07/2017), Anxiety disorder, COPD (chronic obstructive pulmonary disease) (HCC), GERD without esophagitis, Healthcare-associated pneumonia (08/07/2017), History of esophagogastroduodenoscopy (EGD), Hypertension, Severe mitral valve regurgitation (08/07/2017), and Severe sepsis with septic shock (HCC) (08/07/2017).   Surgical History    Past Surgical History:  Procedure Laterality Date  . COLONOSCOPY    . COLONOSCOPY WITH ESOPHAGOGASTRODUODENOSCOPY (EGD)    . EYE SURGERY    . IR GASTROSTOMY TUBE MOD SED  08/15/2017  . TUBAL LIGATION       Social History   Social History   Socioeconomic History  . Marital status: Married    Spouse name: Not on file  . Number of children: Not on file  . Years of education: Not on file  . Highest education level: Not on file  Occupational History  . Not on file  Social Needs  . Financial resource strain: Not on file  . Food insecurity:    Worry: Not on file    Inability: Not on file  . Transportation needs:    Medical: Not on file    Non-medical: Not on file  Tobacco Use  . Smoking  status: Former Games developer  . Smokeless tobacco: Never Used  Substance and Sexual Activity  . Alcohol use: Not Currently  . Drug use: Not Currently  . Sexual activity: Not Currently  Lifestyle  . Physical activity:    Days per week: Not on file    Minutes per session: Not on file  . Stress: Not on file  Relationships  . Social connections:    Talks on phone: Not on file    Gets together: Not on file    Attends religious service: Not on file    Active member of club or organization: Not on file    Attends meetings of clubs or organizations: Not on file    Relationship status: Not on file  . Intimate partner violence:    Fear of current or ex partner: Not on file    Emotionally abused: Not on file    Physically abused: Not on file    Forced sexual activity: Not on file  Other Topics Concern  . Not on file  Social History Narrative  . Not on file  ,  reports that she has quit smoking. She has never used smokeless tobacco. She reports that she drank alcohol. She reports that she has current or past drug history.   Family history   Her Family history is unknown by patient.   Allergies Not on File  Home meds  Prior to  Admission medications   Not on File     CRITICAL CARE Performed by: Lynnell Catalan   Total critical care time: 50 minutes  Critical care time was exclusive of separately billable procedures and treating other patients.  Critical care was necessary to treat or prevent imminent or life-threatening deterioration.  Critical care was time spent personally by me on the following activities: development of treatment plan with patient and/or surrogate as well as nursing, discussions with consultants, evaluation of patient's response to treatment, examination of patient, obtaining history from patient or surrogate, ordering and performing treatments and interventions, ordering and review of laboratory studies, ordering and review of radiographic studies, pulse oximetry and  re-evaluation of patient's condition.   Lynnell Catalan, MD [facility] ICU Physician [facility] [place] [facility]  Pager: [phone] Mobile: [phone] After hours: [phone].

## 2017-11-14 NOTE — Progress Notes (Signed)
Responded to page to support husband and staff as EDP talked to husband about patients condition.  Patient is nearing EOL.   Doctor explained that the patient is critical and husband should call son and other family.  Husband said that the son and he could only meet with doctors on tomorrow.  I introduced the [facility] to him and explained the process. Provided empathetic listening, presence and emotional support. Will follow as needed.  Venida Jarvis, [place], [facility], Pager [phone]

## 2017-11-15 DIAGNOSIS — Z515 Encounter for palliative care: Secondary | ICD-10-CM

## 2017-11-15 DIAGNOSIS — Z7189 Other specified counseling: Secondary | ICD-10-CM

## 2017-11-15 DIAGNOSIS — J9602 Acute respiratory failure with hypercapnia: Secondary | ICD-10-CM

## 2017-11-15 LAB — CBC
HCT: 25.2 % — ABNORMAL LOW (ref 36.0–46.0)
Hemoglobin: 6.6 g/dL — CL (ref 12.0–15.0)
MCH: 25 pg — ABNORMAL LOW (ref 26.0–34.0)
MCHC: 26.2 g/dL — AB (ref 30.0–36.0)
MCV: 95.5 fL (ref 78.0–100.0)
PLATELETS: 315 10*3/uL (ref 150–400)
RBC: 2.64 MIL/uL — ABNORMAL LOW (ref 3.87–5.11)
RDW: 19.6 % — AB (ref 11.5–15.5)
WBC: 25.3 10*3/uL — AB (ref 4.0–10.5)

## 2017-11-15 LAB — GLUCOSE, CAPILLARY
GLUCOSE-CAPILLARY: 90 mg/dL (ref 70–99)
GLUCOSE-CAPILLARY: 94 mg/dL (ref 70–99)
Glucose-Capillary: 118 mg/dL — ABNORMAL HIGH (ref 70–99)
Glucose-Capillary: 86 mg/dL (ref 70–99)
Glucose-Capillary: 154 mg/dL — ABNORMAL HIGH (ref 70–99)

## 2017-11-15 LAB — BLOOD GAS, ARTERIAL
Acid-base deficit: 0.4 mmol/L (ref 0.0–2.0)
Bicarbonate: 25.7 mmol/L (ref 20.0–28.0)
Drawn by: 249101
FIO2: 50
O2 Saturation: 98.5 %
PEEP: 5 cmH2O
Patient temperature: 98.6
RATE: 20 resp/min
VT: 350 mL
pCO2 arterial: 58.2 mmHg — ABNORMAL HIGH (ref 32.0–48.0)
pH, Arterial: 7.268 — ABNORMAL LOW (ref 7.350–7.450)
pO2, Arterial: 128 mmHg — ABNORMAL HIGH (ref 83.0–108.0)

## 2017-11-15 LAB — PHOSPHORUS
PHOSPHORUS: 5.7 mg/dL — AB (ref 2.5–4.6)
Phosphorus: 6.7 mg/dL — ABNORMAL HIGH (ref 2.5–4.6)

## 2017-11-15 LAB — BASIC METABOLIC PANEL
ANION GAP: 6 (ref 5–15)
BUN: 84 mg/dL — ABNORMAL HIGH (ref 8–23)
CALCIUM: 8.5 mg/dL — AB (ref 8.9–10.3)
CO2: 26 mmol/L (ref 22–32)
CREATININE: 1.66 mg/dL — AB (ref 0.44–1.00)
Chloride: 117 mmol/L — ABNORMAL HIGH (ref 98–111)
GFR calc Af Amer: 36 mL/min — ABNORMAL LOW (ref >60)
GFR, EST NON AFRICAN AMERICAN: 31 mL/min — AB (ref >60)
Glucose, Bld: 116 mg/dL — ABNORMAL HIGH (ref 70–99)
Potassium: 5.1 mmol/L (ref 3.5–5.1)
Sodium: 149 mmol/L — ABNORMAL HIGH (ref 135–145)

## 2017-11-15 LAB — MAGNESIUM
Magnesium: 2 mg/dL (ref 1.7–2.4)
Magnesium: 2.2 mg/dL (ref 1.7–2.4)

## 2017-11-15 LAB — PROCALCITONIN: PROCALCITONIN: 19.57 ng/mL

## 2017-11-15 LAB — HIV ANTIBODY (ROUTINE TESTING W REFLEX): HIV Screen 4th Generation wRfx: NONREACTIVE

## 2017-11-15 LAB — PREPARE RBC (CROSSMATCH)

## 2017-11-15 MED ORDER — FREE WATER
250.0000 mL | Freq: Four times a day (QID) | Status: DC
  Administered 2017-11-15 – 2017-11-19 (×13): 250 mL

## 2017-11-15 MED ORDER — VITAL AF 1.2 CAL PO LIQD
1500.0000 mL | ORAL | Status: DC
  Administered 2017-11-15 – 2017-11-19 (×4): 1500 mL
  Filled 2017-11-15 (×5): qty 1500

## 2017-11-15 MED ORDER — SODIUM CHLORIDE 0.9% IV SOLUTION
Freq: Once | INTRAVENOUS | Status: AC
  Administered 2017-11-15: 11:00:00 via INTRAVENOUS

## 2017-11-15 NOTE — Progress Notes (Signed)
eLink Physician-Brief Progress Note Patient Name: Tiffany Gordon DOB: Oct 09, 1950 MRN: [id number]   Date of Service  11/15/2017  HPI/Events of Note  Hgb = 6.6.  eICU Interventions  Will transfuse 1 unit PRBC.     Intervention Category Major Interventions: Other:  Yekaterina Escutia Dennard Nip 11/15/2017, 6:05 AM

## 2017-11-15 NOTE — Progress Notes (Signed)
CRITICAL VALUE ALERT  Critical Value:  Hemoglobin 6.6  Date & Time Notied:  11/15/17 6:05 AM   Provider Notified: Dr. Arsenio Loader  Orders Received/Actions taken: 1 unit PRBCs

## 2017-11-15 NOTE — Progress Notes (Signed)
NAME:  Tiffany Gordon, MRN:  [id number], DOB:  03-07-1950, LOS: 1 ADMISSION DATE:  2017/12/11, CONSULTATION DATE:  12/11/17 REFERRING MD:  Cheri Rous - ED [facility], CHIEF COMPLAINT:  Worsening respiratory failure.   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    Consults: date of consult/date signed off & final recs:  PCCM 2017-12-11  Procedures (surgical and bedside):  N/A  Subjective:  Unable due to non-verbal  Objective   Blood pressure 117/80, pulse 99, temperature 98.6 F (37 C), temperature source Axillary, resp. rate (!) 22, height 5' (1.524 m), weight 38.1 kg, SpO2 100 %.    Vent Mode: PRVC FiO2 (%):  [40 %-50 %] 40 % Set Rate:  [20 bmp] 20 bmp Vt Set:  [290 mL-350 mL] 350 mL PEEP:  [5 cmH20] 5 cmH20 Plateau Pressure:  [11 cmH20-31 cmH20] 11 cmH20   Intake/Output Summary (Last 24 hours) at 11/15/2017 0957 Last data filed at 11/15/2017 0500 Gross per 24 hour  Intake 1331.75 ml  Output 680 ml  Net 651.75 ml   Filed Weights   12-11-17 1128 12-11-17 1630 11/15/17 0442  Weight: 52.6 kg 37.3 kg 38.1 kg    Examination: General: Frail, cachectic elderly female on vent.  HENT: Mucus membranes dry. Tracheostomy in place with no skin breakdown. Lungs: rhonchi throughout. Vent dyssynchrony. Looking better on pressure support. Cardiovascular: normotensive. Extremities warm, HS normal.  Abdomen: PEG in place.  Extremities: No acute deformity Neuro:Alert, but appears disoriented. Does not follow commands.  GU: Clear urine Foley.  Resolved Hospital Problem list    Assessment & Plan:   Acute on chronic respiratory failure with severe hypercarbia. While there may be a component of acute infection or COPD exacerbation, the CXR is not substantially different from her severely abnormal baseline. I suspect that this may represent the progression of her  underlying illness.  - mechanical ventilation - changed to PSV for comfort. Tolerating well.  - empiric abx - systemic steroids - Scheduled and PRN bronchodilators   Failure to thrive: Patient's poor long-term outcome discussed with the patient's husband on admission.  He is aware she has dismal prognosis. Elected for quality over quantity and DNR status. Ultimately has elected for comfort care once family is available. Palliative following to assist.  - continue low dose fentanyl ifusion - Further goal   Severe malnutrition - resume tube feeds via PEG tube.  History of HFPEF with markedly elevated BNP - appears hypo to eu - volemic - no further diuresis  Hypernatremia due to increased insensible losses. - Free water flushes with feeds.  Anemia of chronic illness hgb 6.6 10/3. Transfused one unit PRBC by [facility]. No evidence bleeding.  - follow CBC  Disposition / Summary of Today's Plan 11/15/17   Transfer to SDU: plan is ultimately for comfort care. Continue empiric ABX, steroids, BDs.     Nutritional status and diet: severe malnutrition - restart feeds Pain/Anxiety/Delirium reduction: fentanyl infusion. VAP protocol (if indicated) initiated. DVT prophylaxis: UFH  GI prophylaxis: Famotidine Hyperglycemia protocol: SSI - at high hypoglycemia risk. Mobility:Bedrest Antibiotic de-escalation: HCAP coverage  Code Status: DNR Family Communication: as documented above.  Labs and Ancillary Testing (reviewed)   CBC CBC Latest Ref Rng & Units 11/15/2017 12-11-2017 09/04/2017  WBC 4.0 - 10.5 K/uL 25.3(H) 22.4(H) 15.4(H)  Hemoglobin 12.0 - 15.0 g/dL 6.6(LL) 7.3(L) 10.0(L)  Hematocrit 36.0 - 46.0 % 25.2(L) 26.8(L) 33.1(L)  Platelets 150 - 400 K/uL 315 331 217   BMP Latest Ref Rng & Units 11/15/2017 12/10/17 09/04/2017  Glucose 70 - 99 mg/dL 098(J) 191(Y) 782(N)  BUN 8 - 23 mg/dL 56(O) 13(Y) 17  Creatinine 0.44 - 1.00 mg/dL 8.65(H) 8.46(N) 6.29  Sodium 135 - 145 mmol/L 149(H) 151(H)  138  Potassium 3.5 - 5.1 mmol/L 5.1 4.4 4.0  Chloride 98 - 111 mmol/L 117(H) 115(H) 103  CO2 22 - 32 mmol/L 26 28 25   Calcium 8.9 - 10.3 mg/dL 5.2(W) 8.9 4.1(L)    Review of Systems:   Unable to perform given mental status.  Past medical history  She,  has a past medical history of Acute metabolic encephalopathy (08/07/2017), Acute on chronic respiratory failure with hypoxia (HCC) (08/07/2017), Alcohol withdrawal seizure with complication, with unspecified complication (HCC) (08/07/2017), Anxiety disorder, COPD (chronic obstructive pulmonary disease) (HCC), GERD without esophagitis, Healthcare-associated pneumonia (08/07/2017), History of esophagogastroduodenoscopy (EGD), Hypertension, Severe mitral valve regurgitation (08/07/2017), and Severe sepsis with septic shock (HCC) (08/07/2017).   Surgical History    Past Surgical History:  Procedure Laterality Date  . COLONOSCOPY    . COLONOSCOPY WITH ESOPHAGOGASTRODUODENOSCOPY (EGD)    . EYE SURGERY    . IR GASTROSTOMY TUBE MOD SED  08/15/2017  . TUBAL LIGATION       Social History   Social History   Socioeconomic History  . Marital status: Married    Spouse name: Not on file  . Number of children: Not on file  . Years of education: Not on file  . Highest education level: Not on file  Occupational History  . Not on file  Social Needs  . Financial resource strain: Not on file  . Food insecurity:    Worry: Not on file    Inability: Not on file  . Transportation needs:    Medical: Not on file    Non-medical: Not on file  Tobacco Use  . Smoking status: Former Games developer  . Smokeless tobacco: Never Used  Substance and Sexual Activity  . Alcohol use: Not Currently  . Drug use: Not Currently  . Sexual activity: Not Currently  Lifestyle  . Physical activity:    Days per week: Not on file    Minutes per session: Not on file  . Stress: Not on file  Relationships  . Social connections:    Talks on phone: Not on file    Gets together:  Not on file    Attends religious service: Not on file    Active member of club or organization: Not on file    Attends meetings of clubs or organizations: Not on file    Relationship status: Not on file  . Intimate partner violence:    Fear of current or ex partner: Not on file    Emotionally abused: Not on file    Physically abused: Not on file    Forced sexual activity: Not on file  Other Topics Concern  . Not on file  Social History Narrative  . Not on file  ,  reports that she has quit smoking. She has never used smokeless tobacco. She reports that she drank alcohol. She reports that she has current or past drug history.   Family history   Her Family history is unknown by patient.   Allergies Allergies  Allergen Reactions  . Codeine Nausea And Vomiting    Home meds  Prior to Admission medications   Not on File      [facility],  AGACNP-BC [facility] Pulmonary/Critical Care Pager [phone] or [phone]  11/15/2017 10:21 AM

## 2017-11-15 NOTE — Consult Note (Signed)
Consultation Note Date: 11/15/2017   Patient Name: Tiffany Gordon  DOB: 12-10-1950  MRN: [id number]  Age / Sex: 67 y.o., female  PCP: Rosaria Ferries, MD Referring Physician: Kipp Brood, MD  Reason for Consultation: Establishing goals of care  HPI/Patient Profile: 67 y.o. 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 She had been at [facility] since May 2019.  Found to have acute on chronic respiratory failure and with severe hypercarbia. Patient has PEG tube and is receiving tube feedings. PMT consulted for [place].  Clinical Assessment and Goals of Care: I have reviewed medical records including EPIC notes, labs and imaging, received report from RN, assessed the patient and then met with patient's son, Merrilee Seashore, and husband, Ludwig Clarks,  to discuss diagnosis prognosis, GOC, EOL wishes, disposition and options.  I introduced Palliative Medicine as specialized medical care for people living with serious illness. It focuses on providing relief from the symptoms and stress of a serious illness. The goal is to improve quality of life for both the patient and the family.  Family lives in [place] - over an hour drive to hospital. They talk about the difficulty being here.  They tell me about patient's illness and complications since May of 2019 - specifically talked about her dependence on ventilator and inability to wean for the past couple of months. They tell me of initial improvement after transfer to LTAC - able to be off vent for 12 hours - but has declined since. They also talk about her frailty. They reference conversation yesterday with critical care doctor and understand that their goal for her to be off the vent and return home is highly unlikely.   Natural disease trajectory and  expectations at EOL were discussed.  I attempted to elicit values and goals of care important to the patient.  They discuss that she would not want to live this way.  The difference between aggressive medical intervention and comfort care was considered in light of the patient's goals of care. They would like to focus on her comfort - we specifically discussed withdrawal of ventilator support and using medications for symptom management. Family agrees that this is the best way to proceed; however, they want the patient's daughter here. They tell me the patient's daughter lives in [place] and is struggling to make it to the hospital. They request a few days to figure out a plan to get the patient's daughter to the hospital. They would like to continue current plan of care until the patient's daughter can be present.  Questions and concerns were addressed. The family was encouraged to call with questions or concerns.   Primary Decision Maker NEXT OF KIN - husband Phyllicia Dudek    SUMMARY OF RECOMMENDATIONS   - ultimate goal is comfort care with withdrawal of ventilator support - however, patient's daughter is in [place] and struggling to come - family is working on plan to get her here in the coming days and would like to continue current care until she can be here - no escalation of care  Code Status/Advance Care Planning:  Limited code -no CPR/defib   Symptom Management:   Continue fentanyl drip  Palliative Prophylaxis:   Aspiration, Bowel Regimen, Delirium Protocol, Frequent Pain Assessment, Oral Care and Turn Reposition  Prognosis:   Unable to determine  Discharge Planning: To Be Determined      Primary  Diagnoses: Present on Admission: . Acute respiratory failure with hypercapnia ([place])   I have reviewed the medical record, interviewed the patient and family, and examined the patient. The following aspects are pertinent.  Past Medical History:  Diagnosis Date  . Acute  metabolic encephalopathy 8/76/8115  . Acute on chronic respiratory failure with hypoxia ([place]) 08/07/2017  . Alcohol withdrawal seizure with complication, with unspecified complication ([place]) 09/07/2033  . Anxiety disorder   . COPD (chronic obstructive pulmonary disease) ([place])   . GERD without esophagitis   . Healthcare-associated pneumonia 08/07/2017  . History of esophagogastroduodenoscopy (EGD)   . Hypertension   . Severe mitral valve regurgitation 08/07/2017  . Severe sepsis with septic shock ([place]) 08/07/2017   Social History   Socioeconomic History  . Marital status: Married    Spouse name: Not on file  . Number of children: Not on file  . Years of education: Not on file  . Highest education level: Not on file  Occupational History  . Not on file  Social Needs  . Financial resource strain: Not on file  . Food insecurity:    Worry: Not on file    Inability: Not on file  . Transportation needs:    Medical: Not on file    Non-medical: Not on file  Tobacco Use  . Smoking status: Former Research scientist (life sciences)  . Smokeless tobacco: Never Used  Substance and Sexual Activity  . Alcohol use: Not Currently  . Drug use: Not Currently  . Sexual activity: Not Currently  Lifestyle  . Physical activity:    Days per week: Not on file    Minutes per session: Not on file  . Stress: Not on file  Relationships  . Social connections:    Talks on phone: Not on file    Gets together: Not on file    Attends religious service: Not on file    Active member of club or organization: Not on file    Attends meetings of clubs or organizations: Not on file    Relationship status: Not on file  Other Topics Concern  . Not on file  Social History Narrative  . Not on file   Family History  Family history unknown: Yes   Scheduled Meds: . sodium chloride   Intravenous Once  . chlorhexidine gluconate (MEDLINE KIT)  15 mL Mouth Rinse BID  . dexamethasone  10 mg Intravenous Daily  . famotidine  20 mg Oral Daily    . feeding supplement (PRO-STAT SUGAR FREE 64)  30 mL Per Tube BID  . feeding supplement (VITAL HIGH PROTEIN)  1,000 mL Per Tube Q24H  . heparin  5,000 Units Subcutaneous Q12H  . insulin aspart  1-3 Units Subcutaneous Q4H  . ipratropium-albuterol  3 mL Nebulization Q6H  . mouth rinse  15 mL Mouth Rinse 10 times per day   Continuous Infusions: . sodium chloride 10 mL/hr at 12/06/2017 1800  . ceFEPime (MAXIPIME) IV    . fentaNYL infusion INTRAVENOUS 100 mcg/hr (11/15/17 0500)  . sodium chloride     And  . sodium chloride    . vancomycin     PRN Meds:.sodium chloride, acetaminophen, albuterol, fentaNYL, ondansetron (ZOFRAN) IV Allergies  Allergen Reactions  . Codeine Nausea And Vomiting   Review of Systems  Unable to perform ROS: Intubated    Physical Exam  Constitutional: She appears cachectic. She is uncooperative. She appears ill. No distress. She is intubated.  HENT:  Head: Normocephalic and atraumatic.  Cardiovascular: Normal rate and  regular rhythm.  Pulmonary/Chest: She is intubated. She has decreased breath sounds.  Musculoskeletal: She exhibits no edema.  Frail, thin  Neurological:  sedated  Psychiatric: Cognition and memory are impaired.    Vital Signs: BP 117/80   Pulse 99   Temp 98.6 F (37 C) (Axillary)   Resp (!) 22   Ht 5' (1.524 m)   Wt 38.1 kg   SpO2 100%   BMI 16.40 kg/m  Pain Scale: CPOT       SpO2: SpO2: 100 % O2 Device:SpO2: 100 % O2 Flow Rate: .   IO: Intake/output summary:   Intake/Output Summary (Last 24 hours) at 11/15/2017 0942 Last data filed at 11/15/2017 0500 Gross per 24 hour  Intake 1331.75 ml  Output 680 ml  Net 651.75 ml    LBM: Last BM Date: (PTA) Baseline Weight: Weight: 52.6 kg Most recent weight: Weight: 38.1 kg     Palliative Assessment/Data: PPS 30%    Time Total: 70 minutes Greater than 50%  of this time was spent counseling and coordinating care related to the above assessment and plan.  Juel Burrow,  DNP, AGNP-C Palliative Medicine Team [phone] Pager: [phone]

## 2017-11-15 NOTE — Progress Notes (Signed)
Initial Nutrition Assessment  DOCUMENTATION CODES:   Severe malnutrition in context of chronic illness, Underweight  INTERVENTION:   Tube Feeding:  Vital AF 1.2 @ 40 ml/hr Provides 1152 kcals, 72 g of protein and 778 mL of free water Meets 94% calorie needs, 100% protein needs  Recommend addition of free water flushes: 250 mL free water q 6 hours  Monitor magnesium and phosphorus q 12 hours for at least 4 occurences, MD to replete as needed, as pt is at risk for refeeding syndrome given severe malnutrition, underweight status.  NUTRITION DIAGNOSIS:   Severe Malnutrition related to chronic illness(severe COPD, VDRF) as evidenced by severe fat depletion, severe muscle depletion.  GOAL:   Patient will meet greater than or equal to 90% of their needs  MONITOR:   TF tolerance, Vent status, Labs, Weight trends  REASON FOR ASSESSMENT:   Consult, Ventilator Enteral/tube feeding initiation and management  ASSESSMENT:   67 yo female admitted from [facility] with acute on chronic respiratory failure with probable HCAP, hypernatremia. 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 Calculated free water deficit of 1.22 L  No family at bedside, unable to obtain diet or weight history at this time  G-tube in place; Adult Tube Feeding Protocol initiated yesterday and Vital High Protein @ 40 ml/hr infusing via G-tube; pt appear sot be tolerating. Unable to determine pt's outpatient TF prescription at this time.   Labs: sodium 149, potassium 5.1, Creatinine 1.66, BUN 84, phosphorus 5.7 (H), CBGs 86-122 Meds: ss novolog, fentanyl    NUTRITION - FOCUSED PHYSICAL EXAM:   Most Recent Value  Orbital Region  Severe depletion  Upper Arm Region  Severe depletion  Thoracic and Lumbar Region  Severe depletion  Buccal Region  Severe depletion  Temple Region  Severe depletion  Clavicle Bone Region  Severe depletion  Clavicle and Acromion Bone Region  Severe depletion  Scapular Bone Region  Severe depletion  Dorsal Hand  Severe depletion  Patellar Region  Severe depletion  Anterior Thigh Region  Severe depletion  Posterior Calf Region  Severe depletion  Edema (RD Assessment)  None       Diet Order:   Diet Order            Diet NPO time specified  Diet effective now              EDUCATION NEEDS:   Not appropriate for education at this time  Skin:  Skin Assessment: Reviewed RN Assessment(no pressure ulcer noted)  Last BM:  no BM  Height:   Ht Readings from Last 1 Encounters:  11/24/2017 5' (1.524 m)    Weight:   Wt Readings from Last 1 Encounters:  11/15/17 38.1 kg    Ideal Body Weight:  45.5 kg  BMI:  Body mass index is 16.4 kg/m.  Estimated Nutritional Needs:   Kcal:  1227 kcals   Protein:  68-84 g  Fluid:  >/= 1.2 L   Romelle Starcher MS, RD, LDN, CNSC [phone] Pager  [phone] Weekend/On-Call Pager

## 2017-11-15 NOTE — Progress Notes (Signed)
eLink Physician-Brief Progress Note Patient Name: Tiffany Gordon DOB: 05/04/1950 MRN: [id number]   Date of Service  11/15/2017  HPI/Events of Note  Oliguria - Bladder scan with > 400 mL residual.   eICU Interventions  Will order: 1. I/O Cath PRN.      Intervention Category Intermediate Interventions: Oliguria - evaluation and management  Tiffany Gordon 11/15/2017, 3:20 AM

## 2017-11-16 DIAGNOSIS — E43 Unspecified severe protein-calorie malnutrition: Secondary | ICD-10-CM

## 2017-11-16 LAB — TYPE AND SCREEN
ABO/RH(D): O POS
Antibody Screen: NEGATIVE
UNIT DIVISION: 0

## 2017-11-16 LAB — URINE CULTURE: Culture: NO GROWTH

## 2017-11-16 LAB — CBC
HCT: 28.4 % — ABNORMAL LOW (ref 36.0–46.0)
HEMOGLOBIN: 7.8 g/dL — AB (ref 12.0–15.0)
MCH: 26.6 pg (ref 26.0–34.0)
MCHC: 27.5 g/dL — ABNORMAL LOW (ref 30.0–36.0)
MCV: 96.9 fL (ref 78.0–100.0)
PLATELETS: 330 10*3/uL (ref 150–400)
RBC: 2.93 MIL/uL — ABNORMAL LOW (ref 3.87–5.11)
RDW: 18.8 % — AB (ref 11.5–15.5)
WBC: 23 10*3/uL — AB (ref 4.0–10.5)

## 2017-11-16 LAB — BASIC METABOLIC PANEL
ANION GAP: 10 (ref 5–15)
BUN: 96 mg/dL — ABNORMAL HIGH (ref 8–23)
CALCIUM: 8.1 mg/dL — AB (ref 8.9–10.3)
CO2: 25 mmol/L (ref 22–32)
Chloride: 117 mmol/L — ABNORMAL HIGH (ref 98–111)
Creatinine, Ser: 1.73 mg/dL — ABNORMAL HIGH (ref 0.44–1.00)
GFR calc Af Amer: 34 mL/min — ABNORMAL LOW (ref >60)
GFR, EST NON AFRICAN AMERICAN: 30 mL/min — AB (ref >60)
GLUCOSE: 113 mg/dL — AB (ref 70–99)
Potassium: 4.2 mmol/L (ref 3.5–5.1)
SODIUM: 152 mmol/L — AB (ref 135–145)

## 2017-11-16 LAB — BPAM RBC
BLOOD PRODUCT EXPIRATION DATE: 201910252359
ISSUE DATE / TIME: 201910031116
UNIT TYPE AND RH: 5100

## 2017-11-16 LAB — GLUCOSE, CAPILLARY
GLUCOSE-CAPILLARY: 112 mg/dL — AB (ref 70–99)
GLUCOSE-CAPILLARY: 139 mg/dL — AB (ref 70–99)
GLUCOSE-CAPILLARY: 97 mg/dL (ref 70–99)
GLUCOSE-CAPILLARY: 98 mg/dL (ref 70–99)
Glucose-Capillary: 148 mg/dL — ABNORMAL HIGH (ref 70–99)
Glucose-Capillary: 146 mg/dL — ABNORMAL HIGH (ref 70–99)

## 2017-11-16 LAB — MAGNESIUM: Magnesium: 2 mg/dL (ref 1.7–2.4)

## 2017-11-16 LAB — PHOSPHORUS: PHOSPHORUS: 5 mg/dL — AB (ref 2.5–4.6)

## 2017-11-16 LAB — PROCALCITONIN: Procalcitonin: 19.88 ng/mL

## 2017-11-16 MED ORDER — CHLORHEXIDINE GLUCONATE CLOTH 2 % EX PADS
6.0000 | MEDICATED_PAD | Freq: Every day | CUTANEOUS | Status: DC
  Administered 2017-11-16 – 2017-11-19 (×4): 6 via TOPICAL

## 2017-11-16 MED ORDER — SODIUM CHLORIDE 0.9% FLUSH
10.0000 mL | Freq: Two times a day (BID) | INTRAVENOUS | Status: DC
  Administered 2017-11-16 – 2017-11-19 (×4): 10 mL

## 2017-11-16 MED ORDER — MIDAZOLAM HCL 2 MG/2ML IJ SOLN
1.0000 mg | INTRAMUSCULAR | Status: DC | PRN
  Filled 2017-11-16: qty 2

## 2017-11-16 MED ORDER — DEXTROSE-NACL 5-0.45 % IV SOLN
INTRAVENOUS | Status: DC
  Administered 2017-11-16 – 2017-11-17 (×2): via INTRAVENOUS

## 2017-11-16 MED ORDER — SODIUM CHLORIDE 0.9% FLUSH: 10.0000 mL | INTRAVENOUS | Status: DC | PRN

## 2017-11-16 MED ORDER — MIDAZOLAM HCL 2 MG/2ML IJ SOLN
1.0000 mg | INTRAMUSCULAR | Status: DC | PRN
  Administered 2017-11-16 – 2017-11-19 (×2): 1 mg via INTRAVENOUS
  Filled 2017-11-16: qty 2

## 2017-11-16 NOTE — Progress Notes (Signed)
NAME:  Tiffany Gordon, MRN:  [id number], DOB:  [birth year], LOS: 2 ADMISSION DATE:  11/21/2017, CONSULTATION DATE:  11/18/2017 REFERRING MD:  Cheri Rous - ED [facility], CHIEF COMPLAINT:  Worsening respiratory failure.   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  Admitted for HCAP/COPD  Consults: date of consult/date signed off & final recs:  PCCM 11/25/2017  Procedures (surgical and bedside):  N/A  Subjective:  Unable due to non-verbal  Objective   Blood pressure 121/69, pulse (!) 105, temperature 99.7 F (37.6 C), temperature source Rectal, resp. rate 16, height 5' (1.524 m), weight 41.7 kg, SpO2 99 %.    Vent Mode: PSV;CPAP FiO2 (%):  [50 %] 50 % Set Rate:  [20 bmp] 20 bmp Vt Set:  [350 mL] 350 mL PEEP:  [5 cmH20] 5 cmH20 Pressure Support:  [10 cmH20] 10 cmH20 Plateau Pressure:  [19 cmH20] 19 cmH20   Intake/Output Summary (Last 24 hours) at 11/16/2017 0903 Last data filed at 11/16/2017 0800 Gross per 24 hour  Intake 1709 ml  Output -  Net 1709 ml   Filed Weights   12/10/2017 1630 11/15/17 0442 11/16/17 0500  Weight: 37.3 kg 38.1 kg 41.7 kg    Examination: General: frail cachectic elderly female on vent HENT: Trach in place.  Lungs: Coarse bilateral. Appears air hungry despite vent support. Cardiovascular: RRR, no MRG Abdomen: PEG in place.  Extremities: No acute deformity Neuro: Alert, but disoriented.  GU: Clear urine Foley.  Resolved Hospital Problem list    Assessment & Plan:   Acute on chronic respiratory failure with severe hypercarbia. While there may be a component of acute infection or COPD exacerbation, the CXR is not substantially different from her severely abnormal baseline. Suspect that this may represent the progression of her underlying illness.  - mechanical ventilation - continue PSV for now, may need to place on full  support as we increase her comfort mediations per discussion with husband.  - empiric abx day 3 - DC vanco. MRSA PCR neg. Follow cultures.  - systemic steroids. Last dose today (decadron) - Scheduled and PRN bronchodilators   Failure to thrive: Updated husband this morning and have noted palliative discussion. He understands her condition is no longer survivable to a meaningful quality of life. Ok with increasing comfort meds, while continuing to treat COPD/PNA until family can arrive from [place]. I have been clear with him that any more than a couple of days of continuing treatment this way is only causing her undue suffering, seeing as we have accepted she will not survive.  - increase fentanyl rate - await timing of full comfort from family.  Severe malnutrition - resume tube feeds via PEG tube.  History of HFPEF with markedly elevated BNP - appears hypo to eu - volemic - no further diuresis  Hypernatremia due to increased insensible losses. - Free water flushes with feeds. - Start D5half   Anemia of chronic illness hgb 6.6 10/3. Transfused one unit PRBC by [facility]. No evidence bleeding.  - follow CBC  Disposition / Summary of Today's Plan 11/16/17   Transfer to SDU: plan is ultimately for comfort care. Continue empiric ABX, steroids, BDs.     Nutritional status and diet: severe malnutrition - restart feeds Pain/Anxiety/Delirium reduction: fentanyl infusion. VAP protocol (if indicated) initiated. DVT prophylaxis: UFH  GI prophylaxis: Famotidine Hyperglycemia protocol: SSI - at high hypoglycemia risk. Mobility:Bedrest Antibiotic de-escalation:  HCAP coverage  Code Status: DNR Family Communication: husband updated today.   Labs and Ancillary Testing (reviewed)   CBC CBC Latest Ref Rng & Units 11/16/2017 11/15/2017 2017/11/24  WBC 4.0 - 10.5 K/uL 23.0(H) 25.3(H) 22.4(H)  Hemoglobin 12.0 - 15.0 g/dL 7.8(L) 6.6(LL) 7.3(L)  Hematocrit 36.0 - 46.0 % 28.4(L) 25.2(L) 26.8(L)    Platelets 150 - 400 K/uL 330 315 331   BMP Latest Ref Rng & Units 11/16/2017 11/15/2017 11-24-2017  Glucose 70 - 99 mg/dL 161(W) 960(A) 540(J)  BUN 8 - 23 mg/dL 81(X) 91(Y) 78(G)  Creatinine 0.44 - 1.00 mg/dL 9.56(O) 1.30(Q) 6.57(Q)  Sodium 135 - 145 mmol/L 152(H) 149(H) 151(H)  Potassium 3.5 - 5.1 mmol/L 4.2 5.1 4.4  Chloride 98 - 111 mmol/L 117(H) 117(H) 115(H)  CO2 22 - 32 mmol/L 25 26 28   Calcium 8.9 - 10.3 mg/dL 8.1(L) 8.5(L) 8.9    Joneen Roach, AGACNP-BC [facility] Pulmonary/Critical Care Pager [phone] or [phone]  11/16/2017 9:03 AM

## 2017-11-16 NOTE — Progress Notes (Signed)
VAST RN called and spoke with unit RN, Lequita Halt regarding need for PIV for pt's Cefepime, as it is incompatible with Fentanyl. Discussed possibility of exchanging SL PICC for multiple lumen PICC vs administering PRN med, holding Fentanyl for thirty mins while administering Cefepime. Unit RN relayed that pt's family is to have conference to discuss comfort care for pt. VAST RN advised at this time would prefer to minimize pt's discomfort and utilize available access. However,  if PIV is needed, asked unit RN to place an IV consult.

## 2017-11-17 LAB — CBC
HEMATOCRIT: 29.1 % — AB (ref 36.0–46.0)
HEMOGLOBIN: 7.9 g/dL — AB (ref 12.0–15.0)
MCH: 26.8 pg (ref 26.0–34.0)
MCHC: 27.1 g/dL — AB (ref 30.0–36.0)
MCV: 98.6 fL (ref 78.0–100.0)
Platelets: 299 10*3/uL (ref 150–400)
RBC: 2.95 MIL/uL — ABNORMAL LOW (ref 3.87–5.11)
RDW: 19.5 % — AB (ref 11.5–15.5)
WBC: 20.8 10*3/uL — AB (ref 4.0–10.5)

## 2017-11-17 LAB — BASIC METABOLIC PANEL
ANION GAP: 8 (ref 5–15)
BUN: 88 mg/dL — ABNORMAL HIGH (ref 8–23)
CALCIUM: 8.8 mg/dL — AB (ref 8.9–10.3)
CHLORIDE: 116 mmol/L — AB (ref 98–111)
CO2: 26 mmol/L (ref 22–32)
Creatinine, Ser: 1.44 mg/dL — ABNORMAL HIGH (ref 0.44–1.00)
GFR calc non Af Amer: 37 mL/min — ABNORMAL LOW (ref >60)
GFR, EST AFRICAN AMERICAN: 43 mL/min — AB (ref >60)
GLUCOSE: 88 mg/dL (ref 70–99)
Potassium: 4.4 mmol/L (ref 3.5–5.1)
Sodium: 150 mmol/L — ABNORMAL HIGH (ref 135–145)

## 2017-11-17 LAB — GLUCOSE, CAPILLARY
GLUCOSE-CAPILLARY: 74 mg/dL (ref 70–99)
GLUCOSE-CAPILLARY: 114 mg/dL — AB (ref 70–99)
GLUCOSE-CAPILLARY: 116 mg/dL — AB (ref 70–99)
Glucose-Capillary: 213 mg/dL — ABNORMAL HIGH (ref 70–99)

## 2017-11-17 MED ORDER — PHENYTOIN 50 MG PO CHEW
200.0000 mg | CHEWABLE_TABLET | Freq: Two times a day (BID) | ORAL | Status: DC
  Administered 2017-11-17 – 2017-11-19 (×4): 200 mg
  Filled 2017-11-17 (×6): qty 4

## 2017-11-17 MED ORDER — ACETAMINOPHEN 325 MG PO TABS
650.0000 mg | ORAL_TABLET | ORAL | Status: DC | PRN
  Administered 2017-11-17 – 2017-11-18 (×2): 650 mg
  Filled 2017-11-17 (×2): qty 2

## 2017-11-17 MED ORDER — DEXTROSE 50 % IV SOLN
25.0000 mL | Freq: Once | INTRAVENOUS | Status: AC
  Administered 2017-11-17: 25 mL via INTRAVENOUS

## 2017-11-17 MED ORDER — CLONAZEPAM 0.5 MG PO TABS
0.5000 mg | ORAL_TABLET | Freq: Two times a day (BID) | ORAL | Status: DC
  Administered 2017-11-17 – 2017-11-19 (×4): 0.5 mg
  Filled 2017-11-17 (×4): qty 1

## 2017-11-17 MED ORDER — IPRATROPIUM-ALBUTEROL 0.5-2.5 (3) MG/3ML IN SOLN: 3.0000 mL | RESPIRATORY_TRACT | Status: DC | PRN

## 2017-11-17 MED ORDER — DEXTROSE 50 % IV SOLN
INTRAVENOUS | Status: AC
  Filled 2017-11-17: qty 50

## 2017-11-17 MED ORDER — ARFORMOTEROL TARTRATE 15 MCG/2ML IN NEBU
15.0000 ug | INHALATION_SOLUTION | Freq: Two times a day (BID) | RESPIRATORY_TRACT | Status: DC
  Administered 2017-11-17 – 2017-11-19 (×5): 15 ug via RESPIRATORY_TRACT
  Filled 2017-11-17 (×5): qty 2

## 2017-11-17 MED ORDER — AMANTADINE HCL 100 MG PO CAPS
100.0000 mg | ORAL_CAPSULE | Freq: Every day | ORAL | Status: DC
  Administered 2017-11-18 – 2017-11-19 (×2): 100 mg
  Filled 2017-11-17 (×3): qty 1

## 2017-11-17 MED ORDER — FAMOTIDINE 20 MG PO TABS
20.0000 mg | ORAL_TABLET | Freq: Every day | ORAL | Status: DC
  Administered 2017-11-18 – 2017-11-19 (×2): 20 mg
  Filled 2017-11-17 (×2): qty 1

## 2017-11-17 MED ORDER — BUDESONIDE 0.25 MG/2ML IN SUSP
0.2500 mg | Freq: Two times a day (BID) | RESPIRATORY_TRACT | Status: DC
  Administered 2017-11-17 – 2017-11-19 (×5): 0.25 mg via RESPIRATORY_TRACT
  Filled 2017-11-17 (×5): qty 2

## 2017-11-17 NOTE — Progress Notes (Signed)
   NAME:  Tiffany Gordon, MRN:  [id number], DOB:  1950-10-04, LOS: 3 ADMISSION DATE:  11/22/2017, CONSULTATION DATE:  11/15/2017 REFERRING MD:  Cheri Rous - ED [facility], CHIEF COMPLAINT:  Worsening respiratory failure.   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  Admitted for HCAP/COPD  Consults: date of consult/date signed off & final recs:  PCCM 12/12/2017  Procedures (surgical and bedside):  N/A  Subjective:  Pt can't provide hx.  Objective   Blood pressure 115/71, pulse (!) 109, temperature 97.7 F (36.5 C), temperature source Axillary, resp. rate (!) 24, height 5' (1.524 m), weight 42.1 kg, SpO2 (!) 89 %.    Vent Mode: PCV FiO2 (%):  [40 %] 40 % Set Rate:  [16 bmp] 16 bmp PEEP:  [5 cmH20] 5 cmH20   Intake/Output Summary (Last 24 hours) at 11/17/2017 0842 Last data filed at 11/17/2017 0600 Gross per 24 hour  Intake 2599.15 ml  Output -  Net 2599.15 ml   Filed Weights   11/15/17 0442 11/16/17 0500 11/17/17 0335  Weight: 38.1 kg 41.7 kg 42.1 kg    Examination:  General - on vent Eyes - pupils reactive ENT - dry mucosa, trach site clean Cardiac - regular rate/rhythm, no murmur Chest - scattered rhonchi Abdomen - soft, non tender, + bowel sounds Extremities - contracted Skin - no rashes Neuro - opens eyes spontaneously, doesn't follow commands  Assessment & Plan:   Acute on chronic respiratory failure with severe hypercarbia. COPD. - pressure control ventilation - f/u CXR intermittently - pulmicort, brovana - prn duoneb  HCAP. - day 4 of meropenem  Cachexia. - tube feeds  Hypernatremia. CKD 3. - free water  Anemia of critical illness and chronic disease. - s/p transfusion PRBC 10/03 - f/u CBC intermittently  Dementia. - monitor mental status  Disposition / Summary of Today's Plan 11/17/17   Husband wants his daughter to  visit one more time and then likely transition to comfort care early next week.    Nutritional diet: tube feeds DVT prophylaxis: SQ heparin GI prophylaxis: pepcid Mobility:Bedrest Code Status: DNR Family Communication: no family at bedside   Labs and Ancillary Testing (reviewed)   CBC CBC Latest Ref Rng & Units 11/17/2017 11/16/2017 11/15/2017  WBC 4.0 - 10.5 K/uL 20.8(H) 23.0(H) 25.3(H)  Hemoglobin 12.0 - 15.0 g/dL 7.9(L) 7.8(L) 6.6(LL)  Hematocrit 36.0 - 46.0 % 29.1(L) 28.4(L) 25.2(L)  Platelets 150 - 400 K/uL 299 330 315   BMP Latest Ref Rng & Units 11/17/2017 11/16/2017 11/15/2017  Glucose 70 - 99 mg/dL 88 409(W) 119(J)  BUN 8 - 23 mg/dL 47(W) 29(F) 62(Z)  Creatinine 0.44 - 1.00 mg/dL 3.08(M) 5.78(I) 6.96(E)  Sodium 135 - 145 mmol/L 150(H) 152(H) 149(H)  Potassium 3.5 - 5.1 mmol/L 4.4 4.2 5.1  Chloride 98 - 111 mmol/L 116(H) 117(H) 117(H)  CO2 22 - 32 mmol/L 26 25 26   Calcium 8.9 - 10.3 mg/dL 9.5(M) 8.1(L) 8.5(L)   CBG (last 3)  Recent Labs    11/17/17 0412 11/17/17 0526 11/17/17 0836  GLUCAP 74 114* 116*    Coralyn Helling, MD [facility] Pulmonary/Critical Care 11/17/2017, 8:49 AM

## 2017-11-17 NOTE — Progress Notes (Signed)
Patient had an episode of vomiting and RN stopped tubed feed immeditly. However, patient's 0400 CBG is 74. Elink MD notified. Ordered to give PRN Zofran and D50. MD advised to hold tube feed for 2 hours. Will follow hypoglycemia protocol and continue to monitor.

## 2017-11-18 LAB — GLUCOSE, CAPILLARY
GLUCOSE-CAPILLARY: 91 mg/dL (ref 70–99)
Glucose-Capillary: 87 mg/dL (ref 70–99)

## 2017-11-18 LAB — BASIC METABOLIC PANEL
ANION GAP: 8 (ref 5–15)
BUN: 90 mg/dL — AB (ref 8–23)
CALCIUM: 8.9 mg/dL (ref 8.9–10.3)
CO2: 26 mmol/L (ref 22–32)
Chloride: 116 mmol/L — ABNORMAL HIGH (ref 98–111)
Creatinine, Ser: 1.6 mg/dL — ABNORMAL HIGH (ref 0.44–1.00)
GFR calc Af Amer: 38 mL/min — ABNORMAL LOW (ref >60)
GFR calc non Af Amer: 33 mL/min — ABNORMAL LOW (ref >60)
Glucose, Bld: 103 mg/dL — ABNORMAL HIGH (ref 70–99)
Potassium: 4.2 mmol/L (ref 3.5–5.1)
Sodium: 150 mmol/L — ABNORMAL HIGH (ref 135–145)

## 2017-11-18 LAB — CBC WITH DIFFERENTIAL/PLATELET
Abs Immature Granulocytes: 0.4 10*3/uL — ABNORMAL HIGH (ref 0.0–0.1)
BASOS ABS: 0.1 10*3/uL (ref 0.0–0.1)
BASOS PCT: 1 %
EOS PCT: 4 %
Eosinophils Absolute: 0.6 10*3/uL (ref 0.0–0.7)
HCT: 27.3 % — ABNORMAL LOW (ref 36.0–46.0)
Hemoglobin: 7.4 g/dL — ABNORMAL LOW (ref 12.0–15.0)
Immature Granulocytes: 3 %
Lymphocytes Relative: 11 %
Lymphs Abs: 1.6 10*3/uL (ref 0.7–4.0)
MCH: 27 pg (ref 26.0–34.0)
MCHC: 27.1 g/dL — AB (ref 30.0–36.0)
MCV: 99.6 fL (ref 78.0–100.0)
Monocytes Absolute: 1.3 10*3/uL — ABNORMAL HIGH (ref 0.1–1.0)
Monocytes Relative: 9 %
Neutro Abs: 10.5 10*3/uL — ABNORMAL HIGH (ref 1.7–7.7)
Neutrophils Relative %: 72 %
PLATELETS: 287 10*3/uL (ref 150–400)
RBC: 2.74 MIL/uL — ABNORMAL LOW (ref 3.87–5.11)
RDW: 20.6 % — ABNORMAL HIGH (ref 11.5–15.5)
WBC: 14.4 10*3/uL — ABNORMAL HIGH (ref 4.0–10.5)

## 2017-11-18 NOTE — Progress Notes (Signed)
   NAME:  Tiffany Gordon, MRN:  [id number], DOB:  1950/10/24, LOS: 4 ADMISSION DATE:  December 10, 2017, CONSULTATION DATE:  2017/12/10 REFERRING MD:  Cheri Rous - ED [facility], CHIEF COMPLAINT:  Worsening respiratory failure.   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  Admitted for HCAP/COPD  Consults: date of consult/date signed off & final recs:   Procedures (surgical and bedside):   Subjective:  Pt can't provide hx.  Objective   Blood pressure 97/68, pulse 87, temperature 98.1 F (36.7 C), temperature source Oral, resp. rate 19, height 5' (1.524 m), weight 41.6 kg, SpO2 97 %.    Vent Mode: PCV FiO2 (%):  [40 %] 40 % Set Rate:  [16 bmp] 16 bmp PEEP:  [5 cmH20] 5 cmH20 Plateau Pressure:  [22 cmH20-23 cmH20] 23 cmH20   Intake/Output Summary (Last 24 hours) at 11/18/2017 4098 Last data filed at 11/18/2017 0800 Gross per 24 hour  Intake 3687.79 ml  Output -  Net 3687.79 ml   Filed Weights   11/16/17 0500 11/17/17 0335 11/18/17 0208  Weight: 41.7 kg 42.1 kg 41.6 kg    Examination:  General - stares into space Eyes - pupils midpoint ENT - trach site clean Cardiac - regular rate/rhythm, no murmur Chest - scattered rhonchi Abdomen - soft, non tender, + bowel sounds Extremities - contracted Skin - no rashes Neuro - doesn't follow commands  Assessment & Plan:   Acute on chronic respiratory failure with severe hypercarbia. COPD. - pressure control ventilation - pulmicort, brovana, prn duoneb  HCAP. - day 5 of meropenem  Cachexia. - tube feeds  Hypernatremia. CKD 3. - free water  Anemia of critical illness and chronic disease. - s/p transfusion PRBC 10/03  Dementia. - monitor mental status  Disposition / Summary of Today's Plan 11/18/17   Husband wants his daughter to visit one more time and then likely transition to comfort care early  next week.  Will defer additional lab/imaging studies.    Nutritional diet: tube feeds DVT prophylaxis: SQ heparin GI prophylaxis: pepcid Mobility:Bedrest Code Status: DNR Family Communication: no family at bedside   Labs and Ancillary Testing (reviewed)   CBC CBC Latest Ref Rng & Units 11/18/2017 11/17/2017 11/16/2017  WBC 4.0 - 10.5 K/uL 14.4(H) 20.8(H) 23.0(H)  Hemoglobin 12.0 - 15.0 g/dL 7.4(L) 7.9(L) 7.8(L)  Hematocrit 36.0 - 46.0 % 27.3(L) 29.1(L) 28.4(L)  Platelets 150 - 400 K/uL 287 299 330   BMP Latest Ref Rng & Units 11/18/2017 11/17/2017 11/16/2017  Glucose 70 - 99 mg/dL 119(J) 88 478(G)  BUN 8 - 23 mg/dL 95(A) 21(H) 08(M)  Creatinine 0.44 - 1.00 mg/dL 5.78(I) 6.96(E) 9.52(W)  Sodium 135 - 145 mmol/L 150(H) 150(H) 152(H)  Potassium 3.5 - 5.1 mmol/L 4.2 4.4 4.2  Chloride 98 - 111 mmol/L 116(H) 116(H) 117(H)  CO2 22 - 32 mmol/L 26 26 25   Calcium 8.9 - 10.3 mg/dL 8.9 4.1(L) 8.1(L)   CBG (last 3)  Recent Labs    11/17/17 0526 11/17/17 0836 11/18/17 0807  GLUCAP 114* 116* 91    Coralyn Helling, MD [place] Pulmonary/Critical Care 11/18/2017, 9:21 AM

## 2017-11-18 NOTE — Progress Notes (Signed)
Daily Progress Note   Patient Name: Tiffany Gordon       Date: 11/18/2017 DOB: 1950-09-17  Age: 67 y.o. MRN#: [id number] Attending Physician: Chesley Mires, MD Primary Care Physician: Rosaria Ferries, MD Admit Date: 11/20/2017  Reason for Consultation/Follow-up: Establishing goals of care, Non pain symptom management, Pain control, Psychosocial/spiritual support, Terminal Care and Withdrawal of life-sustaining treatment  Subjective: Patient remains on ventilator, eyes open, does not respond to me  Length of Stay: 4  Current Medications: Scheduled Meds:  . amantadine  100 mg Per Tube Daily  . arformoterol  15 mcg Nebulization BID  . budesonide (PULMICORT) nebulizer solution  0.25 mg Nebulization BID  . chlorhexidine gluconate (MEDLINE KIT)  15 mL Mouth Rinse BID  . Chlorhexidine Gluconate Cloth  6 each Topical Daily  . clonazePAM  0.5 mg Per Tube Q12H  . famotidine  20 mg Per Tube Daily  . feeding supplement (VITAL AF 1.2 CAL)  1,500 mL Per Tube Q24H  . free water  250 mL Per Tube Q6H  . heparin  5,000 Units Subcutaneous Q12H  . mouth rinse  15 mL Mouth Rinse 10 times per day  . phenytoin  200 mg Per Tube Q12H  . sodium chloride flush  10-40 mL Intracatheter Q12H    Continuous Infusions: . sodium chloride 1 mL/hr at 11/18/17 0900  . ceFEPime (MAXIPIME) IV 1 g (11/17/17 1306)  . dextrose 5 % and 0.45% NaCl 10 mL/hr at 11/18/17 0900  . fentaNYL infusion INTRAVENOUS 200 mcg/hr (11/18/17 0900)    PRN Meds: sodium chloride, acetaminophen, fentaNYL, ipratropium-albuterol, midazolam, ondansetron (ZOFRAN) IV, sodium chloride flush  Physical Exam         Constitutional: She appears cachectic. She is uncooperative. She appears ill. No distress. She is intubated.  HENT:  Head:  Normocephalic and atraumatic.  Cardiovascular: Normal rate and regular rhythm.  Pulmonary/Chest: She is intubated. She has decreased breath sounds.  Musculoskeletal: She exhibits no edema.  Frail, thin  Neurological:  sedated  Psychiatric: Cognition and memory are impaired.   Vital Signs: BP 115/66   Pulse 91   Temp 98.1 F (36.7 C) (Oral)   Resp 17   Ht 5' (1.524 m)   Wt 41.6 kg   SpO2 99%   BMI 17.91 kg/m  SpO2: SpO2: 99 % O2 Device: O2 Device: Ventilator O2 Flow Rate:    Intake/output summary:   Intake/Output Summary (Last 24 hours) at 11/18/2017 1406 Last data filed at 11/18/2017 0900 Gross per 24 hour  Intake 3433.79 ml  Output 225 ml  Net 3208.79 ml   LBM: Last BM Date: (PTA) Baseline Weight: Weight: 52.6 kg Most recent weight: Weight: 41.6 kg       Palliative Assessment/Data: PPS 30%    Flowsheet Rows     Most Recent Value  Intake Tab  Referral Department  Critical care  Unit at Time of Referral  ICU  Palliative Care Primary Diagnosis  Pulmonary  Date Notified  11/30/2017  Palliative Care Type  New Palliative care  Reason for referral  Clarify Goals of Care  Date of Admission  12/03/2017  Date first seen by Palliative Care  11/15/17  # of days Palliative  referral response time  1 Day(s)  # of days IP prior to Palliative referral  0  Clinical Assessment  Palliative Performance Scale Score  30%  Psychosocial & Spiritual Assessment  Palliative Care Outcomes  Patient/Family meeting held?  Yes  Who was at the meeting?  husband and son  Palliative Care Outcomes  Clarified goals of care, Provided advance care planning, Provided end of life care assistance, Provided psychosocial or spiritual support      Patient Active Problem List   Diagnosis Date Noted  . Protein-calorie malnutrition, severe 11/16/2017  . Goals of care, counseling/discussion   . Palliative care by specialist   . Acute respiratory failure with hypercapnia ([place]) 11/30/2017  . Acute on  chronic respiratory failure with hypoxia ([place]) 08/07/2017  . Severe sepsis with septic shock ([place]) 08/07/2017  . Healthcare-associated pneumonia 08/07/2017  . Acute metabolic encephalopathy 14/11/3011  . Alcohol withdrawal seizure with complication, with unspecified complication ([place]) 14/38/8875  . Severe mitral valve regurgitation 08/07/2017  . COPD (chronic obstructive pulmonary disease) ([facility])     Palliative Care Assessment & Plan   HPI:  67 y.o. 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 She had been at [facility] since May 2019.  Found to have acute on chronic respiratory failure and with severe hypercarbia. Patient has PEG tube and is receiving tube feedings. PMT consulted for [place].  Assessment: 2nd family meeting today with patient's husband, daughter, 2 brothers, and sister.   Again, we talked about patient's illness and complications since May of 2019 - specifically talked about her dependence on ventilator and inability to wean for the past couple of months. They tell me of initial improvement after transfer to LTAC - able to be off vent for 12 hours - but has declined since. They also talk about her frailty. They understand their goal for her to be off the vent and return home is highly unlikely.   Natural disease trajectory and expectations at EOL were discussed.  I attempted to elicit values and goals of care important to the patient.  They discuss that she would not want to live this way.  The difference between aggressive medical intervention and comfort care was considered in light of the patient's goals of care. They would like to focus on her comfort - we specifically discussed withdrawal of ventilator support and using medications for symptom management. Family agrees that this is the best way to proceed.  Patients  daughter is returning to [facility] today. She does not want to be present for withdrawal of care.  Family does not want to prolong suffering and agree to withdrawal of care tomorrow evening so that family can be present after work. Patient's husband, son, and siblings want to be present for withdrawal of care.  Family asks about hospice - we discussed hospital death vs hospice home. Anticipate hospital death.   Questions and concerns were addressed. The family was encouraged to call with questions or concerns  Recommendations/Plan:  Family agrees to withdrawal of care tomorrow - 10/7 Monday - evening (after work) so that they can all be present together  Continue fentanyl infusion and prn versed  No escalation of care  Code Status:  Limited code - no CPR/defib  Prognosis:   Unable to determine  Discharge Planning:  Anticipated Hospital Death  Care plan was discussed with RN, Dr. Halford Chessman, patient's spouse, daughter, and  siblings  Thank you for allowing the Palliative Medicine Team to assist in the care of this patient.   Total Time 40 minutes Prolonged Time Billed  no       Greater than 50%  of this time was spent counseling and coordinating care related to the above assessment and plan.  Juel Burrow, DNP, [facility] Palliative Medicine Team Team Phone # [phone]  Pager [phone]

## 2017-11-19 LAB — BASIC METABOLIC PANEL
ANION GAP: 5 (ref 5–15)
BUN: 91 mg/dL — ABNORMAL HIGH (ref 8–23)
CHLORIDE: 117 mmol/L — AB (ref 98–111)
CO2: 24 mmol/L (ref 22–32)
Calcium: 8.9 mg/dL (ref 8.9–10.3)
Creatinine, Ser: 1.57 mg/dL — ABNORMAL HIGH (ref 0.44–1.00)
GFR calc Af Amer: 39 mL/min — ABNORMAL LOW (ref >60)
GFR calc non Af Amer: 33 mL/min — ABNORMAL LOW (ref >60)
GLUCOSE: 120 mg/dL — AB (ref 70–99)
Potassium: 4 mmol/L (ref 3.5–5.1)
Sodium: 146 mmol/L — ABNORMAL HIGH (ref 135–145)

## 2017-11-19 LAB — CULTURE, BLOOD (ROUTINE X 2)
CULTURE: NO GROWTH
CULTURE: NO GROWTH
SPECIAL REQUESTS: ADEQUATE
SPECIAL REQUESTS: ADEQUATE

## 2017-11-19 LAB — GLUCOSE, CAPILLARY: Glucose-Capillary: 94 mg/dL (ref 70–99)

## 2017-11-19 LAB — CULTURE, RESPIRATORY W GRAM STAIN: Special Requests: NORMAL

## 2017-11-19 LAB — CULTURE, RESPIRATORY

## 2017-11-19 LAB — MAGNESIUM: Magnesium: 1.8 mg/dL (ref 1.7–2.4)

## 2017-11-19 MED ORDER — LIDOCAINE HCL (CARDIAC) PF 100 MG/5ML IV SOSY
1.0000 mg/kg | PREFILLED_SYRINGE | Freq: Once | INTRAVENOUS | Status: AC
  Administered 2017-11-19: 41.6 mg via INTRAVENOUS
  Filled 2017-11-19: qty 5

## 2017-11-29 ENCOUNTER — Telehealth: Payer: Self-pay

## 2017-11-29 NOTE — Telephone Encounter (Signed)
On 11/29/17 Received d/c from [employer] (original). The d/c is for burial.  Patient is a patient of Doctor Byrum.   D/c will be take to Pulmonary Unit for signature.  On 11/30/17 I received d/c back from Doctor Byrum. I got d/c ready and called the funeral home to let them know I mailed d/c to vital records per the funeral home request.

## 2017-12-14 NOTE — Progress Notes (Signed)
Patient was taken off the ventilator at 17:25 per CCM order. Patient's family members and RN were at the bedside.

## 2017-12-14 NOTE — Progress Notes (Addendum)
RN paged Pola Corn on call provider about pts EKG rhythm changes (vfib vs torsades). Pt still has a pulse. Orders received for BMET and mag labs and 1x lidocaine bolus. Will continue to monitor closely. Caswell Corwin, RN 05-Dec-2017 12:14 AM

## 2017-12-14 NOTE — Progress Notes (Signed)
NAME:  Tiffany Gordon, MRN:  [id number], DOB:  Jul 11, 1950, LOS: 5 ADMISSION DATE:  11/13/2017, CONSULTATION DATE:  12/10/2017 REFERRING MD:  Cheri Rous - ED [facility], CHIEF COMPLAINT:  Worsening respiratory failure.   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  Admitted for HCAP/COPD  Consults: date of consult/date signed off & final recs:   Procedures (surgical and bedside):   Subjective:  Not interacting at all Currently on full ventilator support  Objective   Blood pressure 109/61, pulse 81, temperature 99.9 F (37.7 C), resp. rate 19, height 5' (1.524 m), weight 42.5 kg, SpO2 100 %.    Vent Mode: PCV FiO2 (%):  [40 %-50 %] 50 % Set Rate:  [16 bmp] 16 bmp PEEP:  [5 cmH20] 5 cmH20 Plateau Pressure:  [24 cmH20] 24 cmH20   Intake/Output Summary (Last 24 hours) at 12-07-2017 1013 Last data filed at 2017/12/07 0700 Gross per 24 hour  Intake 2070.61 ml  Output 420 ml  Net 1650.61 ml   Filed Weights   11/17/17 0335 11/18/17 0208 December 07, 2017 0124  Weight: 42.1 kg 41.6 kg 42.5 kg    Examination:  General -cachectic ill-appearing woman, completely unresponsive Eyes -pupils equal, sluggish ENT -trach site clean and dry, connected to mechanical ventilation Cardiac -regular, no murmur Chest -scattered bilateral rhonchi, no wheezing Abdomen -soft, benign, positive bowel sounds Extremities -contractures present Skin -no rash Neuro -essentially comatose, no response to voice or pain  Assessment & Plan:   Acute on chronic respiratory failure with severe hypercarbia. COPD. -Tolerating pressure control ventilation, pressure control 20 over PEEP 5, tidal volumes in the mid 300s - Continue current bronchodilator schedule  HCAP. -Day 6 cefepime -Vancomycin discontinued -Plan to stop antibiotics per family ready to transition to comfort  care  Cachexia. -Tube feedings as ordered  Hypernatremia. CKD 3. -Free water as ordered  Anemia of critical illness and chronic disease. -No more labs given overall goals of care  Dementia. Acute on chronic encephalopathy, toxic metabolic -Monitoring mental status, no significant change  Disposition / Summary of Today's Plan 07-Dec-2017   Plan for transition to comfort care later 10/7.  We will stop antibiotics, tube feeding and any invasive maneuvers at that time.    Nutritional diet: tube feeds DVT prophylaxis: SQ heparin GI prophylaxis: pepcid Mobility:Bedrest Code Status: DNR Family Communication: No family bedside 10/7, anticipate their arrival this afternoon after work  Labs and Ancillary Testing (reviewed)   CBC CBC Latest Ref Rng & Units 11/18/2017 11/17/2017 11/16/2017  WBC 4.0 - 10.5 K/uL 14.4(H) 20.8(H) 23.0(H)  Hemoglobin 12.0 - 15.0 g/dL 7.4(L) 7.9(L) 7.8(L)  Hematocrit 36.0 - 46.0 % 27.3(L) 29.1(L) 28.4(L)  Platelets 150 - 400 K/uL 287 299 330   BMP Latest Ref Rng & Units 2017-12-07 11/18/2017 11/17/2017  Glucose 70 - 99 mg/dL 782(N) 562(Z) 88  BUN 8 - 23 mg/dL 30(Q) 65(H) 84(O)  Creatinine 0.44 - 1.00 mg/dL 9.62(X) 5.28(U) 1.32(G)  Sodium 135 - 145 mmol/L 146(H) 150(H) 150(H)  Potassium 3.5 - 5.1 mmol/L 4.0 4.2 4.4  Chloride 98 - 111 mmol/L 117(H) 116(H) 116(H)  CO2 22 - 32 mmol/L 24 26 26   Calcium 8.9 - 10.3 mg/dL 8.9 8.9 4.0(N)   CBG (last 3)  Recent Labs    11/18/17 0807 11/18/17 2000 11/18/17 2318  GLUCAP 91 87 94    Independent CC time 32 minutes   Levy Pupa, MD, PhD  12/07/2017, 10:20 AM [place] [facility] and Critical [facility] [phone] or if no answer [phone]

## 2017-12-14 NOTE — Progress Notes (Signed)
Patient expired at 17:58 with spouse and several family members at bedside. Patient was a DNR and orders received to extubate per Dr Delton Coombes. PAtients time of death 17:58, Dr Delton Coombes notified. 20cc fentanyl wasted and patient pronounced by myself and Silvestre Gunner. [facility] notified and cleared.

## 2017-12-14 NOTE — Progress Notes (Signed)
Daily Progress Note   Patient Name: Tiffany Gordon       Date: 2017/12/01 DOB: 02-24-1950  Age: 67 y.o. MRN#: [id number] Attending Physician: Chesley Mires, MD Primary Care Physician: Rosaria Ferries, MD Admit Date: 12/11/2017  Reason for Consultation/Follow-up: Establishing goals of care, Non pain symptom management, Pain control, Psychosocial/spiritual support, Terminal Care and Withdrawal of life-sustaining treatment  Subjective: Patient remains on ventilator, unresponsive, appears more comfortable than yesterday  Length of Stay: 5  Current Medications: Scheduled Meds:  . amantadine  100 mg Per Tube Daily  . arformoterol  15 mcg Nebulization BID  . budesonide (PULMICORT) nebulizer solution  0.25 mg Nebulization BID  . chlorhexidine gluconate (MEDLINE KIT)  15 mL Mouth Rinse BID  . Chlorhexidine Gluconate Cloth  6 each Topical Daily  . clonazePAM  0.5 mg Per Tube Q12H  . famotidine  20 mg Per Tube Daily  . feeding supplement (VITAL AF 1.2 CAL)  1,500 mL Per Tube Q24H  . free water  250 mL Per Tube Q6H  . heparin  5,000 Units Subcutaneous Q12H  . mouth rinse  15 mL Mouth Rinse 10 times per day  . phenytoin  200 mg Per Tube Q12H  . sodium chloride flush  10-40 mL Intracatheter Q12H    Continuous Infusions: . sodium chloride Stopped (11/18/17 1957)  . ceFEPime (MAXIPIME) IV Stopped (11/18/17 1455)  . dextrose 5 % and 0.45% NaCl 10 mL/hr at 12-01-2017 1000  . fentaNYL infusion INTRAVENOUS 200 mcg/hr (12-01-2017 1000)    PRN Meds: sodium chloride, acetaminophen, fentaNYL, ipratropium-albuterol, midazolam, ondansetron (ZOFRAN) IV, sodium chloride flush  Physical Exam         Constitutional: She appears cachectic. She is uncooperative. She appears ill. No distress. She is intubated.    HENT:  Head: Normocephalic and atraumatic.  Cardiovascular: Normal rate and regular rhythm.  Pulmonary/Chest: She is intubated. She has decreased breath sounds.  Musculoskeletal: She exhibits no edema.  Frail, thin  Neurological:  sedated  Psychiatric: Cognition and memory are impaired.   Vital Signs: BP 107/65   Pulse 78   Temp 99.9 F (37.7 C)   Resp 18   Ht 5' (1.524 m)   Wt 42.5 kg   SpO2 100%   BMI 18.30 kg/m  SpO2: SpO2: 100 % O2 Device: O2 Device: Ventilator O2 Flow Rate:    Intake/output summary:   Intake/Output Summary (Last 24 hours) at 2017-12-01 1101 Last data filed at 2017-12-01 1000 Gross per 24 hour  Intake 2109.6 ml  Output 420 ml  Net 1689.6 ml   LBM: Last BM Date: (PTA) Baseline Weight: Weight: 52.6 kg Most recent weight: Weight: 42.5 kg       Palliative Assessment/Data: PPS 10%    Flowsheet Rows     Most Recent Value  Intake Tab  Referral Department  Critical care  Unit at Time of Referral  ICU  Palliative Care Primary Diagnosis  Pulmonary  Date Notified  12/06/2017  Palliative Care Type  New Palliative care  Reason for referral  Clarify Goals of Care  Date of Admission  11/23/2017  Date first seen by Palliative Care  11/15/17  # of days Palliative referral response time  1 Day(s)  # of days IP prior to Palliative referral  0  Clinical Assessment  Palliative Performance Scale Score  30%  Psychosocial & Spiritual Assessment  Palliative Care Outcomes  Patient/Family meeting held?  Yes  Who was at the meeting?  husband and son  Palliative Care Outcomes  Clarified goals of care, Provided advance care planning, Provided end of life care assistance, Provided psychosocial or spiritual support      Patient Active Problem List   Diagnosis Date Noted  . Protein-calorie malnutrition, severe 11/16/2017  . Goals of care, counseling/discussion   . Palliative care by specialist   . Acute respiratory failure with hypercapnia ([place]) 11/13/2017  .  Acute on chronic respiratory failure with hypoxia ([place]) 08/07/2017  . Severe sepsis with septic shock ([place]) 08/07/2017  . Healthcare-associated pneumonia 08/07/2017  . Acute metabolic encephalopathy 45/85/9292  . Alcohol withdrawal seizure with complication, with unspecified complication ([place]) 44/62/8638  . Severe mitral valve regurgitation 08/07/2017  . COPD (chronic obstructive pulmonary disease) ([facility])     Palliative Care Assessment & Plan   HPI:  67 y.o. female  with past medical history of COPD, trach and ventilator dependence, HF with severe mitral regurgitation, malnutrition, and suspected Wernicke's encephalopathy admitted on 12/02/2017 with AMS. She had been at [facility] since May 2019.  Found to have acute on chronic respiratory failure and with severe hypercarbia. Patient has PEG tube and is receiving tube feedings. PMT consulted for [place].  Assessment: Spoke with Eddie, patient's spouse, again this morning to provide update and confirm plans for withdrawal of care today once family arrives after work. Eddie confirms this plan and tells me everyone who wants to be present will be at the hospital this evening.   Family does not want to prolong suffering and agree to withdrawal of care this evening so that family can be present after work. Patient's husband, son, and siblings want to be present for withdrawal of care.  Questions and concerns were addressed. The family was encouraged to call with questions or concerns.  Discussed plan with Dr. Lamonte Sakai - he is aware of family's wishes and will assist with withdrawal of care this evening.  Recommendations/Plan:  Family agrees to withdrawal of care - 10/7 Monday - evening (after work) so that they can all be present together  Continue fentanyl infusion and prn versed  No escalation of care  Code Status:  Limited code - no CPR/defib  Prognosis:   < 2 weeks  Discharge Planning:  Anticipated Hospital Death  Care plan was  discussed with RN, Dr. Lamonte Sakai, patient's spouse  Thank you for allowing the Palliative Medicine Team to assist in the care of this patient.   Total Time 20 minutes Prolonged Time Billed  no       Greater than 50%  of this time was spent counseling and coordinating care related to the above assessment and plan.  Juel Burrow, DNP, [facility] Palliative Medicine Team Team Phone # [phone]  Pager [phone]

## 2017-12-14 NOTE — Progress Notes (Addendum)
eLink Physician-Brief Progress Note Patient Name: Tiffany Gordon DOB: 1950-11-30 MRN: [id number]   Date of Service  11/29/2017  HPI/Events of Note  Abnormal rhythm - Looks like non sustained VFIB vs torsade. Patient is partial DNR (intubation and mechanical ventilation only).  eICU Interventions  Will order: 1. BMP and Mg++ level STAT. 2. 12 Lead EKG STAT.  3. Bolus with Lidocaine 42 mg IV now.      Intervention Category Major Interventions: Arrhythmia - evaluation and management  [name] [place] 11/26/2017, 12:04 AM

## 2017-12-14 DEATH — deceased

## 2017-12-20 DIAGNOSIS — N179 Acute kidney failure, unspecified: Secondary | ICD-10-CM | POA: Diagnosis present

## 2017-12-20 DIAGNOSIS — N183 Chronic kidney disease, stage 3 unspecified: Secondary | ICD-10-CM | POA: Diagnosis present

## 2017-12-20 DIAGNOSIS — E870 Hyperosmolality and hypernatremia: Secondary | ICD-10-CM | POA: Diagnosis present

## 2017-12-20 DIAGNOSIS — E512 Wernicke's encephalopathy: Secondary | ICD-10-CM | POA: Diagnosis present

## 2018-01-13 NOTE — Death Summary Note (Signed)
  DEATH SUMMARY   Patient Details  Name: Tiffany Gordon MRN: [id number] DOB: [birth year]  Admission/Discharge Information   Admit Date:  19-Nov-2017  Date of Death: Date of Death: November 24, 2017  Time of Death: Time of Death: 1758  Length of Stay: 5  Referring Physician: Shayne Alken, MD   Reason(s) for Hospitalization  Acute encephalopathy, fever  Diagnoses  Preliminary cause of death:   Healthcare associated pneumonia Secondary Diagnoses (including complications and co-morbidities):  Principal Problem:   Healthcare-associated pneumonia Active Problems:   Acute on chronic respiratory failure with hypoxia (HCC)   Severe sepsis with septic shock (HCC)   Acute metabolic encephalopathy   Alcohol withdrawal seizure with complication, with unspecified complication (HCC)   Severe mitral valve regurgitation   COPD (chronic obstructive pulmonary disease) (HCC)   Acute respiratory failure with hypercapnia (HCC)   Protein-calorie malnutrition, severe   Wernicke encephalopathy   Hypernatremia   Acute renal failure superimposed on stage 3 chronic kidney disease [facility])   Brief Hospital Course (including significant findings, care, treatment, and services provided and events leading to death)  Tiffany Gordon is a 67 y.o. year old female who had a history of severe COPD, alcoholism with associated Warnicke encephalopathy, severe protein calorie malnutrition, severe mitral regurgitation with HFpEF.  She had been at [facility] and ventilator dependent via tracheostomy since she had a healthcare associated pneumonia in May 2019.  She was admitted with progressive altered mental status, fever, leukocytosis on 11/24/2017.  Prior to transfer her chronic ventilator settings were adjusted but she was found to have a persistent hypercapnia, PCO2 69 on admission.  In the emergency department and on admission she was treated with broad-spectrum antibiotics for an apparent recurrent  healthcare associated pneumonia, corticosteroids and bronchodilators for an acute exacerbation of her severe COPD.  Cultures ultimately grew out Providencia stuartii that was resistant to multiple antibiotics.  Diuresis was considered but she was noted to have acute on chronic renal failure with associated hypernatremia (151) and ability to do so with limited.  She was ventilated with pressure control ventilation with some improvement in her hypercapnia but she remained encephalopathic, poorly responsive.  Volume resuscitation and free water were administered with improvement in both renal function and hyponatremia.  She was noted to have anemia of critical illness and chronic disease but did not require any transfusion.  Despite all aggressive therapies her mental status did not improve.  It was felt that the chances for meaningful recovery are small and discussions were undertaken with her family to clarify the goals for her care.  Her husband made it clear that the patient would not want to continue invasive support if there were no chance to recover to a good quality of life.  Based on this decision was made to withdraw critical care support on 24-Nov-2017.  This was done later that day with family support present.    Pertinent Labs and Studies   Microbiology Tracheal aspirate Nov 19, 2017 >> Providencia stuartii, resistant to cefazolin, Cipro, gent, Bactrim, Unasyn; sensitive to cefepime, ceftriaxone, imipenem  Blood cultures November 20, 2022 >> negative Urine culture 11-20-2022 >> negative  Levy Pupa, MD, PhD 12/20/2017, 8:40 AM  Pulmonary and Critical Care [phone] or if no answer [phone]

## 2020-03-02 IMAGING — US US THORACENTESIS ASP PLEURAL SPACE W/IMG GUIDE
1 series · 6 of 6 positions shown · non-contrast
Comparison: none

INDICATION: Chronic ventilator dependence. Left pleural effusion. Request for
diagnostic thoracentesis.

[Series 1: us thoracentesis asp pleural space w/img guide · 0.17mm/px · 6 of 6 slices shown]
[im 1/6]
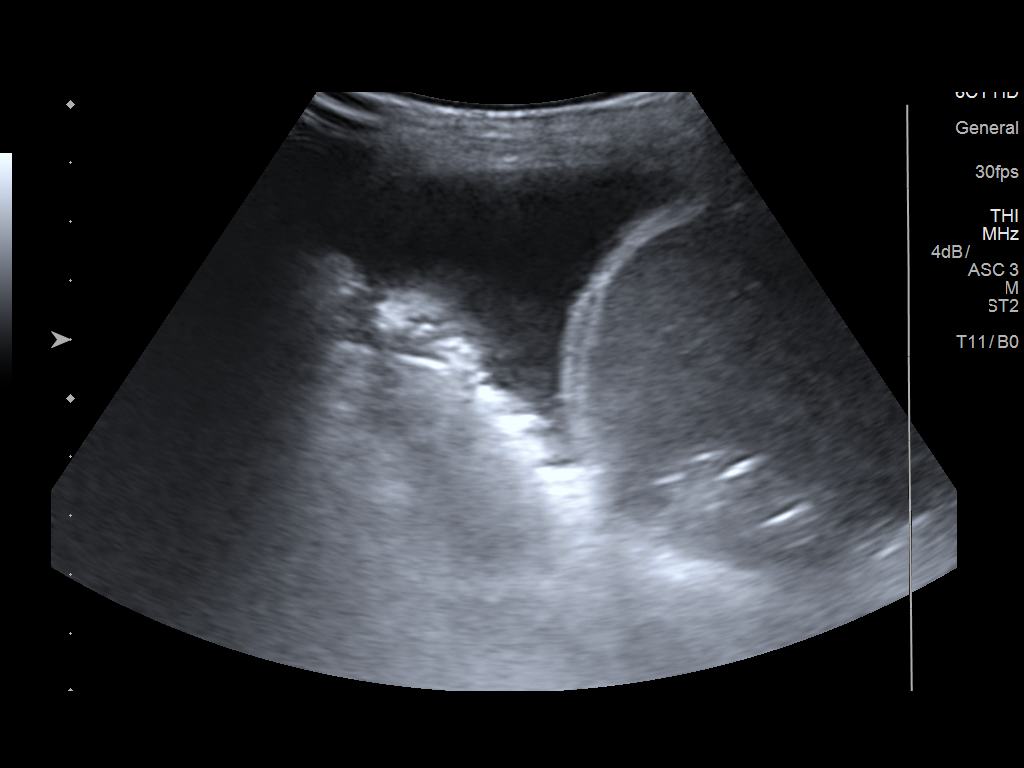
[im 2/6]
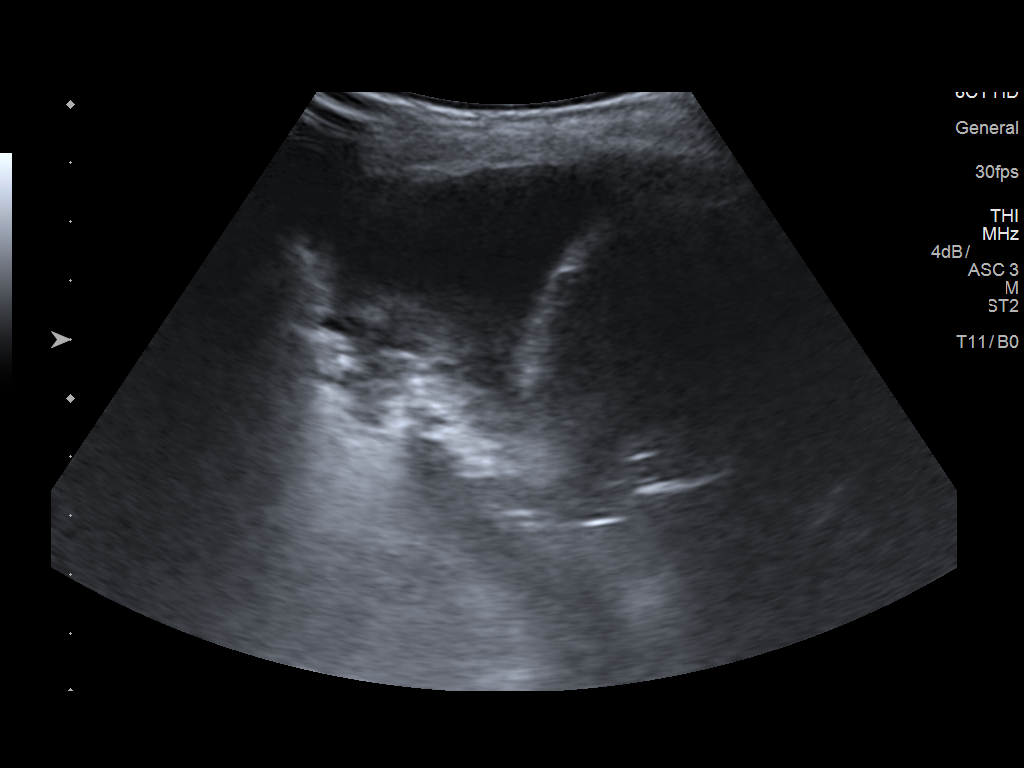
[im 3/6]
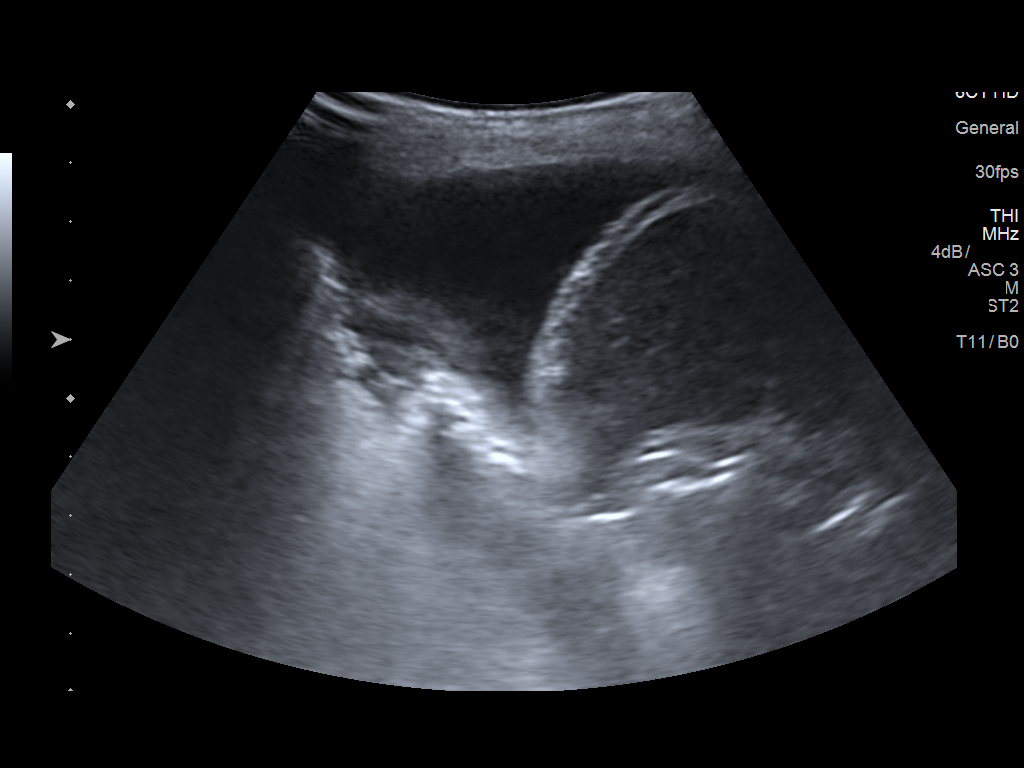
[im 4/6]
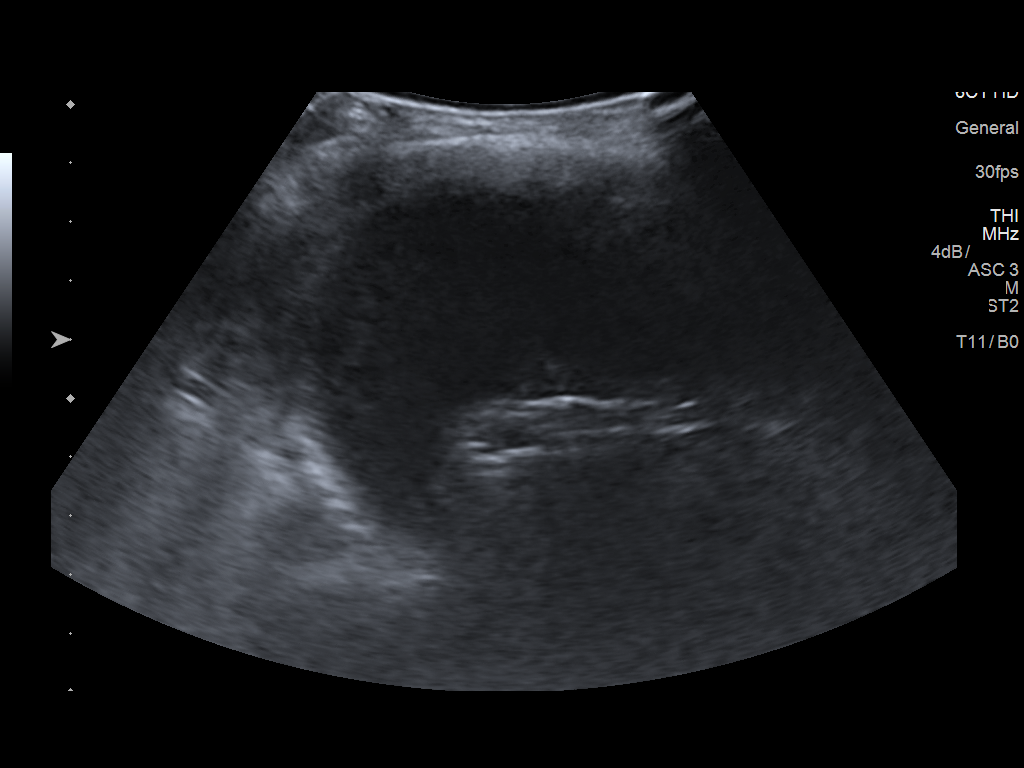
[im 5/6]
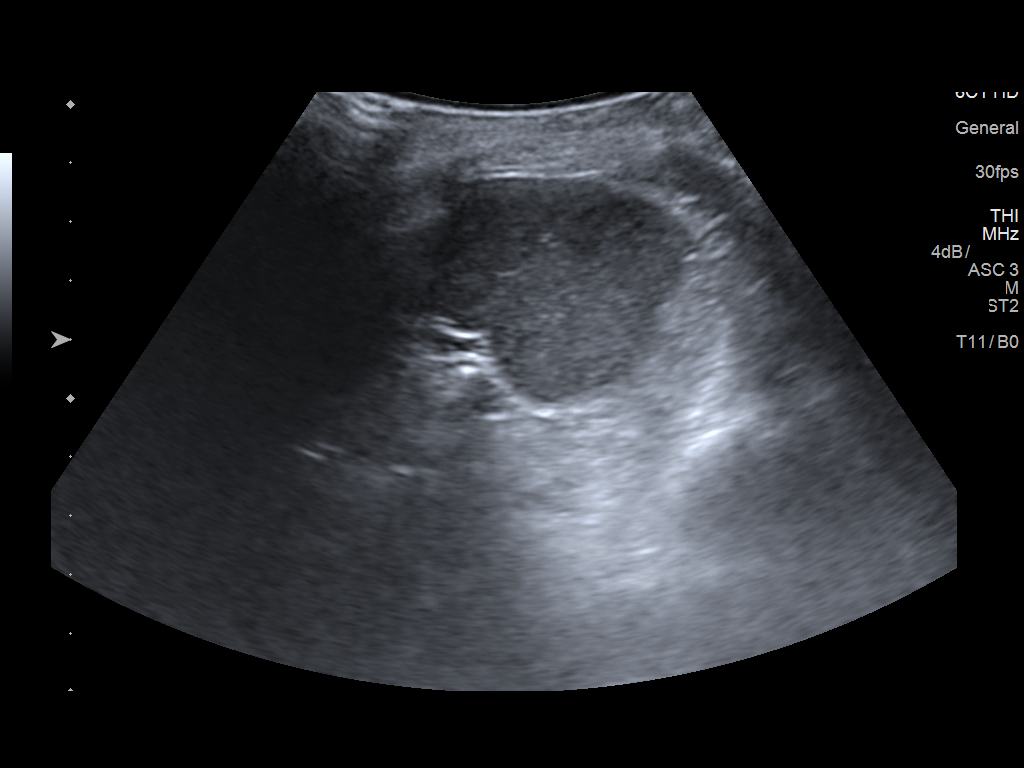
[im 6/6]
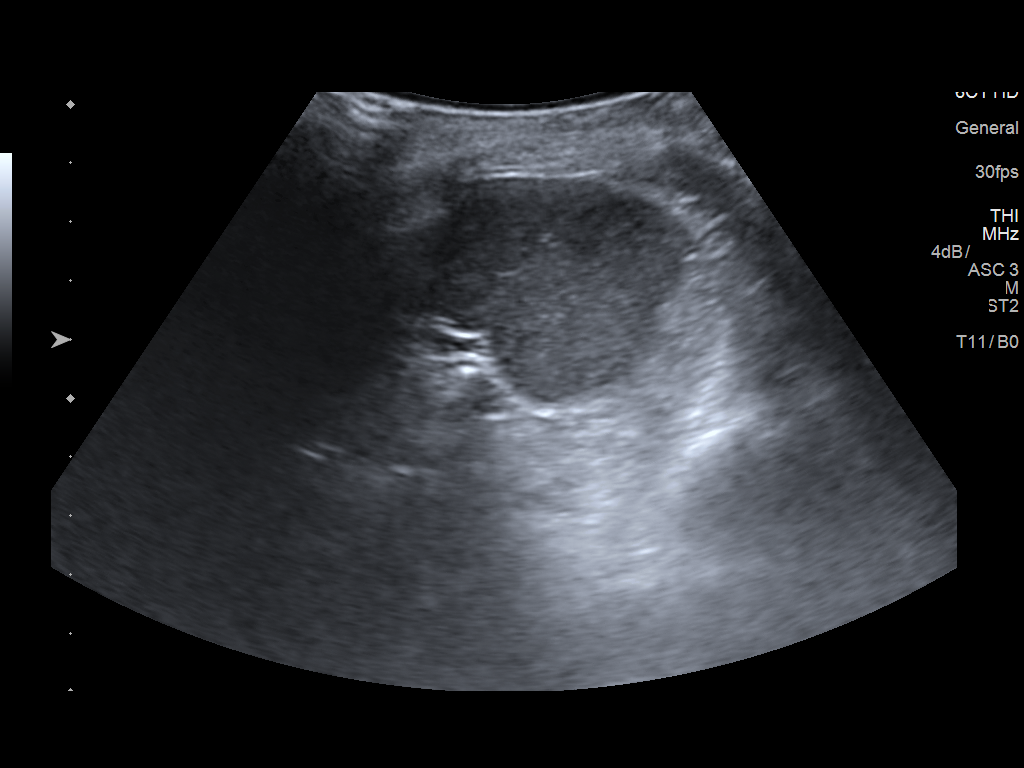

[6 of 6 positions shown; findings below may reference images not displayed]

EXAM:
ULTRASOUND GUIDED LEFT THORACENTESIS

MEDICATIONS:
10 mL 2% lidocaine

COMPLICATIONS:
None immediate.

PROCEDURE:
An ultrasound guided thoracentesis was thoroughly discussed with the
patient and questions answered. The benefits, risks, alternatives
and complications were also discussed. The patient understands and
wishes to proceed with the procedure. Written consent was obtained.

Ultrasound was performed to localize and mark an adequate pocket of
fluid in the left chest. The area was then prepped and draped in the
normal sterile fashion. 2% Lidocaine was used for local anesthesia.
Under ultrasound guidance a 6 Fr Safe-T-Centesis catheter was
introduced. Thoracentesis was performed. The catheter was removed
and a dressing applied.
FINDINGS: A total of approximately 140 mL of serosanguinous fluid was removed.
Samples were sent to the laboratory as requested by the clinical
team.
IMPRESSION: Successful ultrasound guided left thoracentesis yielding 140 mL of
pleural fluid.

Read by Goodar, Pakteen

## 2020-05-16 IMAGING — CT CT ABD-PELV W/ CM
2 of 5 series · 15 of 46 positions shown, 17 images · IV contrast (Omni 300)
Comparison: Unenhanced CT abdomen and pelvis 08/21/2017.

CLINICAL DATA: Possible pancreatic or peripancreatic mass on
unenhanced CT abdomen and pelvis 2 days ago. Severe sepsis upon
hospital admission 08/07/2017.

EXAM:
CT ABDOMEN AND PELVIS WITH CONTRAST
TECHNIQUE: Multidetector CT imaging of the abdomen and pelvis was performed
using the standard protocol following bolus administration of
intravenous contrast.
CONTRAST:  100mL OMNIPAQUE IOHEXOL 300 MG/ML IV.

[Series 3: a/p w/ 5mm · axial · 0.71mm/px · z∈[-607,-227]mm · 12 of 86 slices shown, 14 images]
[im 5/86  soft-tissue]
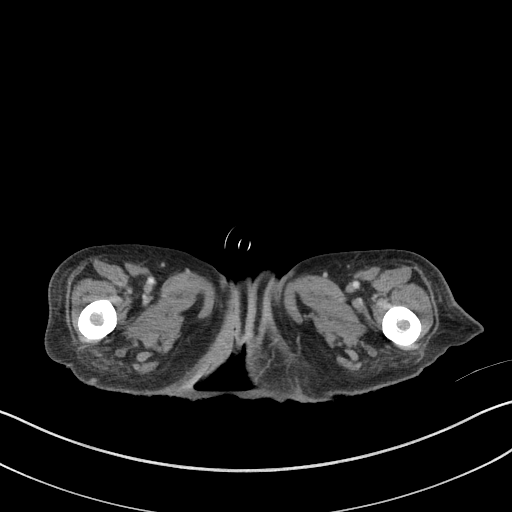
[im 5/86  bone]
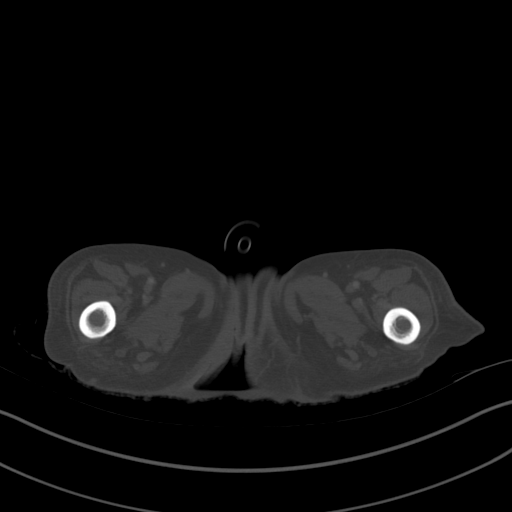
[im 14/86  soft-tissue]
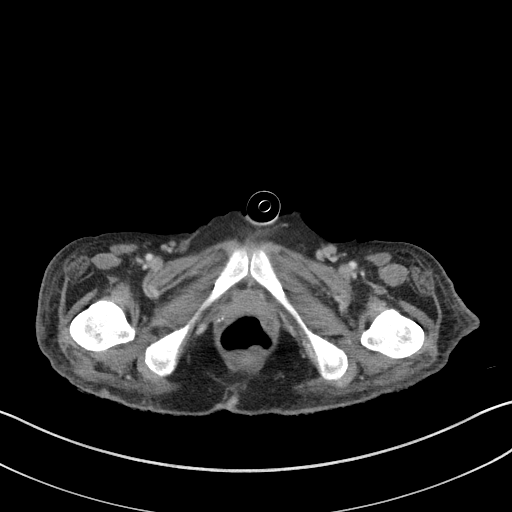
[im 18/86  soft-tissue]
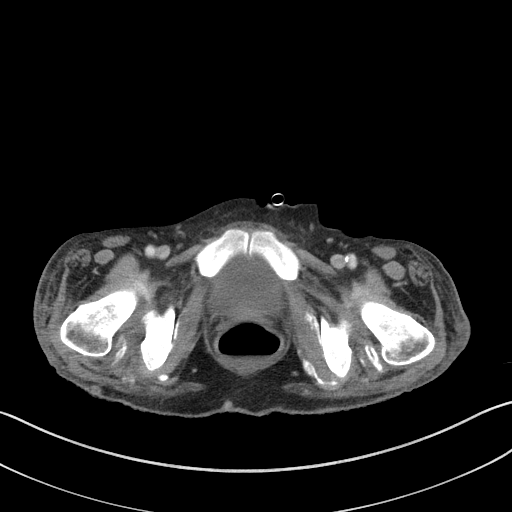
[im 27/86  soft-tissue]
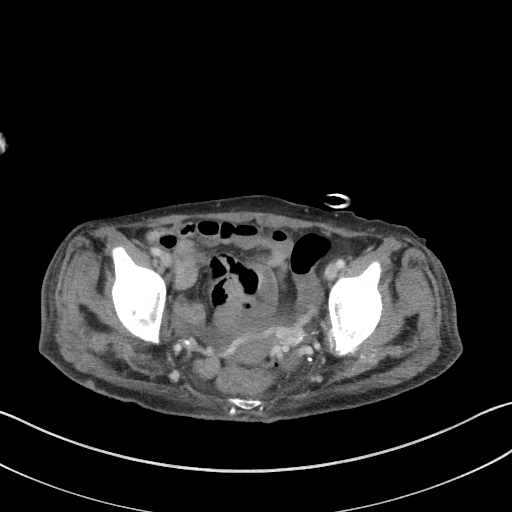
[im 32/86  soft-tissue]
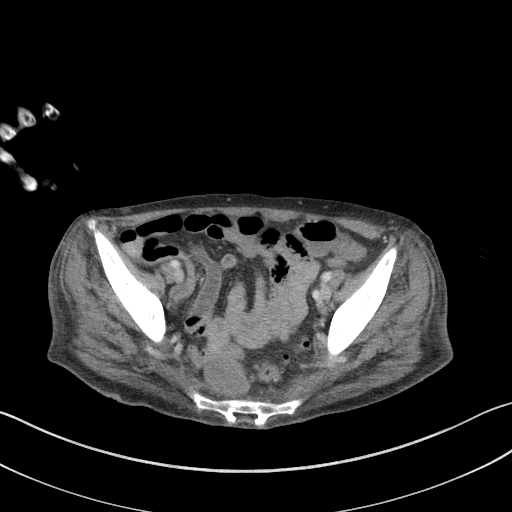
[im 41/86  soft-tissue]
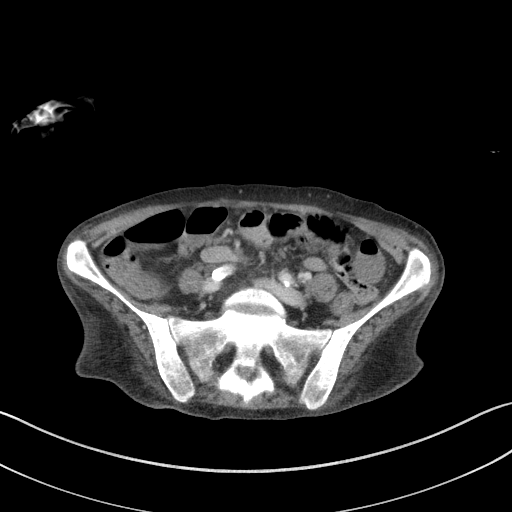
[im 45/86  soft-tissue]
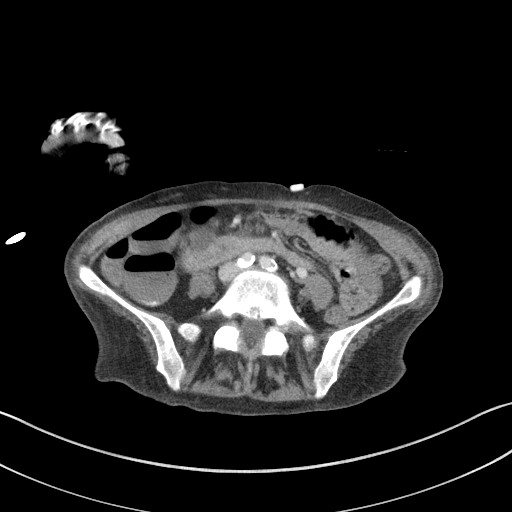
[im 54/86  soft-tissue]
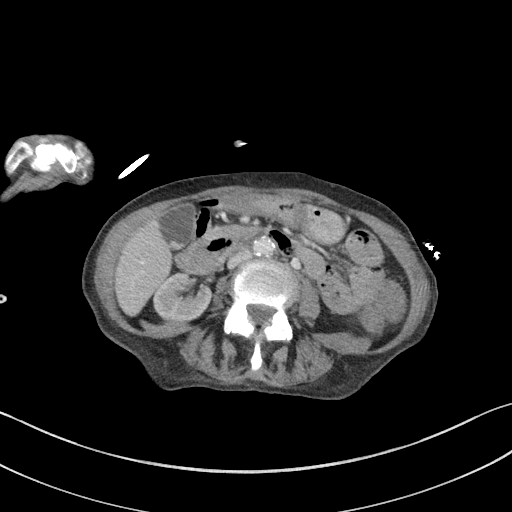
[im 59/86  soft-tissue]
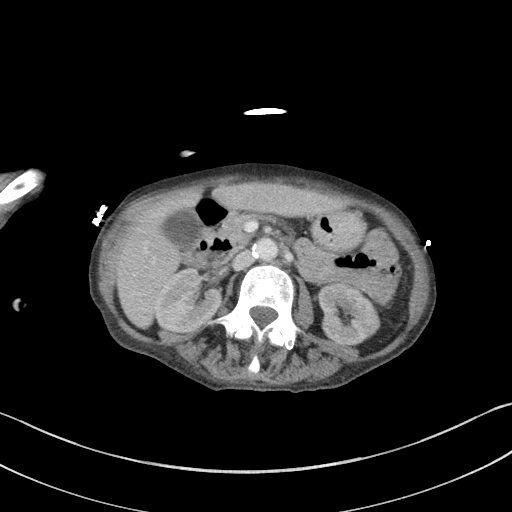
[im 59/86  bone]
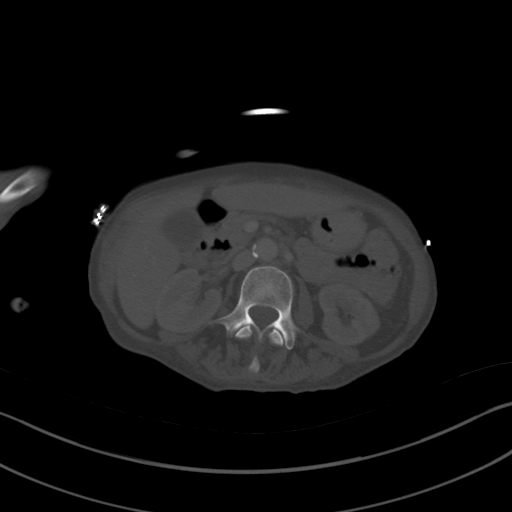
[im 68/86  soft-tissue]
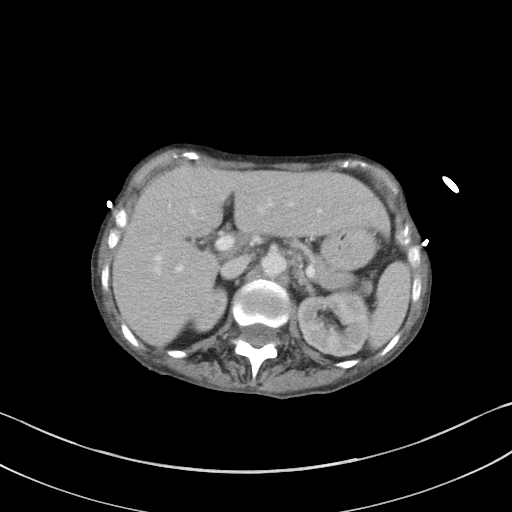
[im 72/86  soft-tissue]
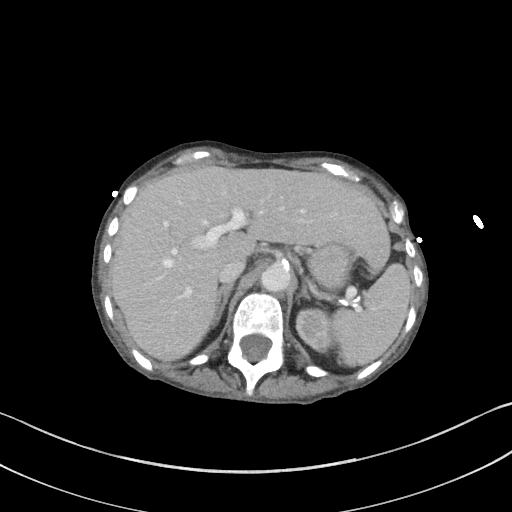
[im 81/86  soft-tissue]
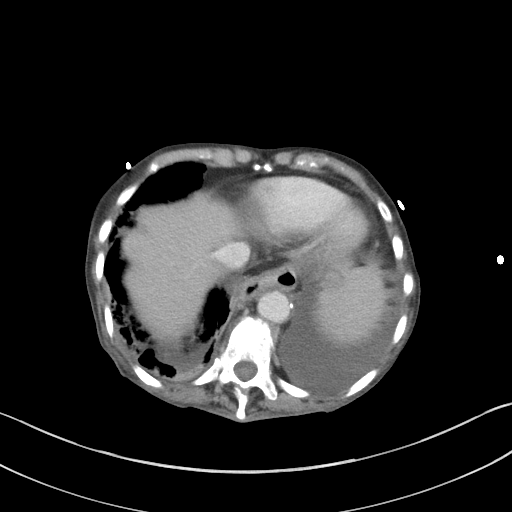

[Series 6: a/p w/ cor · coronal · 0.66mm/px · 3 of 109 slices shown]
[im 37/109  soft-tissue]
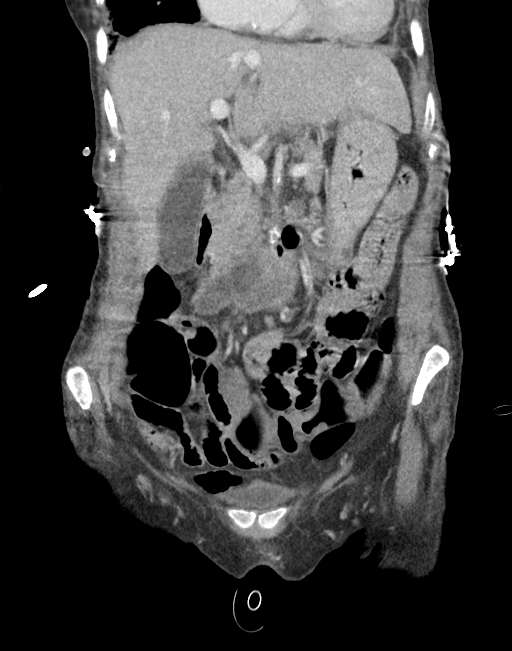
[im 49/109  soft-tissue]
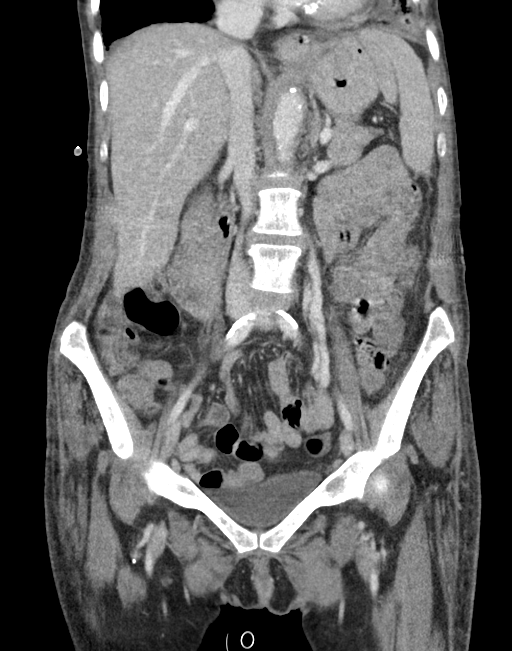
[im 61/109  soft-tissue]
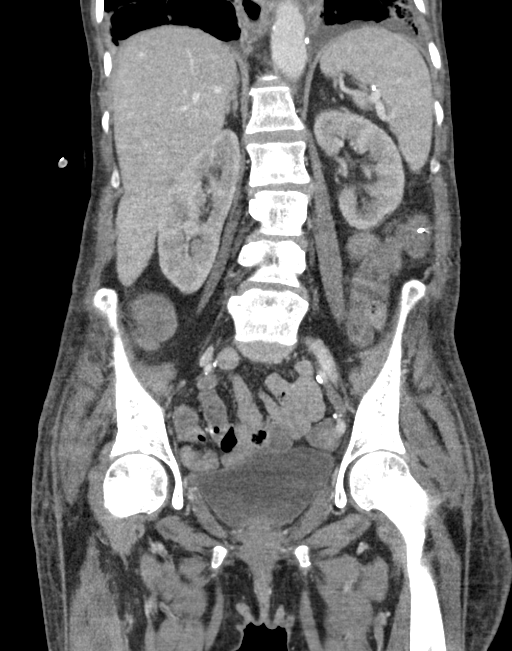

[15 of 46 positions shown; findings below may reference images not displayed]

FINDINGS: Lower chest: Coarse interstitial opacities in the visualized lung
bases, unchanged from the CT 2 days ago. Moderate-sized LEFT pleural
effusion and small RIGHT pleural effusion, unchanged. Stable
moderate to marked cardiomegaly, mitral annular calcification and
RIGHT coronary artery atherosclerosis.

Hepatobiliary: Liver normal in size and appearance. Anatomic variant
in that the LEFT lobe of the liver extends well across the midline
into the LEFT UPPER QUADRANT. Small gallstones within the
gallbladder. No pericholecystic inflammation. No biliary ductal
dilation.

Pancreas: Approximate 2.4 x 5.8 x 4.1 cm cystic mass adjacent to and
possibly arising from the head and uncinate of the pancreas. There
are no visible internal septations, and the cyst fluid is of low
attenuation measuring 9 Hounsfield units. Pancreas otherwise normal
in appearance. No acute peripancreatic inflammation.

Spleen: Normal in size and appearance.

Adrenals/Urinary Tract: Normal appearing adrenal glands. Kidneys
normal in size and appearance without focal parenchymal abnormality.
No evidence of urinary tract calculi or obstruction.
Normal-appearing urinary bladder.

Stomach/Bowel: Gastrostomy tube within the decompressed stomach. No
complicating features. Diverticulum arising medially from the
descending duodenum. Small bowel otherwise normal in appearance.
Liquid stool throughout the colon. Sigmoid colon diverticulosis
without evidence of acute diverticulitis. Linear opaque ingested
material in the descending colon.

Vascular/Lymphatic: Severe aorto-iliofemoral atherosclerosis without
evidence of aneurysm. Normal-appearing portal venous and systemic
venous systems. LEFT ovarian varicocele.

Reproductive: Atrophic uterus and ovaries.

Other: Dystrophic calcifications/myositis ossifications adjacent to
the BILATERAL acetabula and ischia.

Musculoskeletal: Degenerative disc disease at T11-12. Mild osseous
demineralization. No acute findings.
IMPRESSION: 1. Cystic mass adjacent to the head and uncinate process of the
pancreas with simple cyst fluid, most likely representing a
pseudocyst. Follow-up CT in 2-3 months is recommended.
2. Gastrostomy tube within the decompressed stomach. No complicating
features.
3. Cholelithiasis without evidence of acute cholecystitis.
4. Sigmoid colon diverticulosis without evidence of acute
diverticulitis.
5. Moderate-sized LEFT pleural effusion small RIGHT pleural
effusion, unchanged since the CT 2 days ago.
6. Coarse interstitial opacities involving the visualized lung
bases, unchanged.
7.  Aortic Atherosclerosis (M3ETG-170.0)

## 2020-08-07 IMAGING — DX DG CHEST 1V PORT
1 series · 1 of 1 positions shown · non-contrast
Comparison: Radiograph September 04, 2017.

CLINICAL DATA: Shortness of breath.

EXAM:
PORTABLE CHEST 1 VIEW

[chest ap]
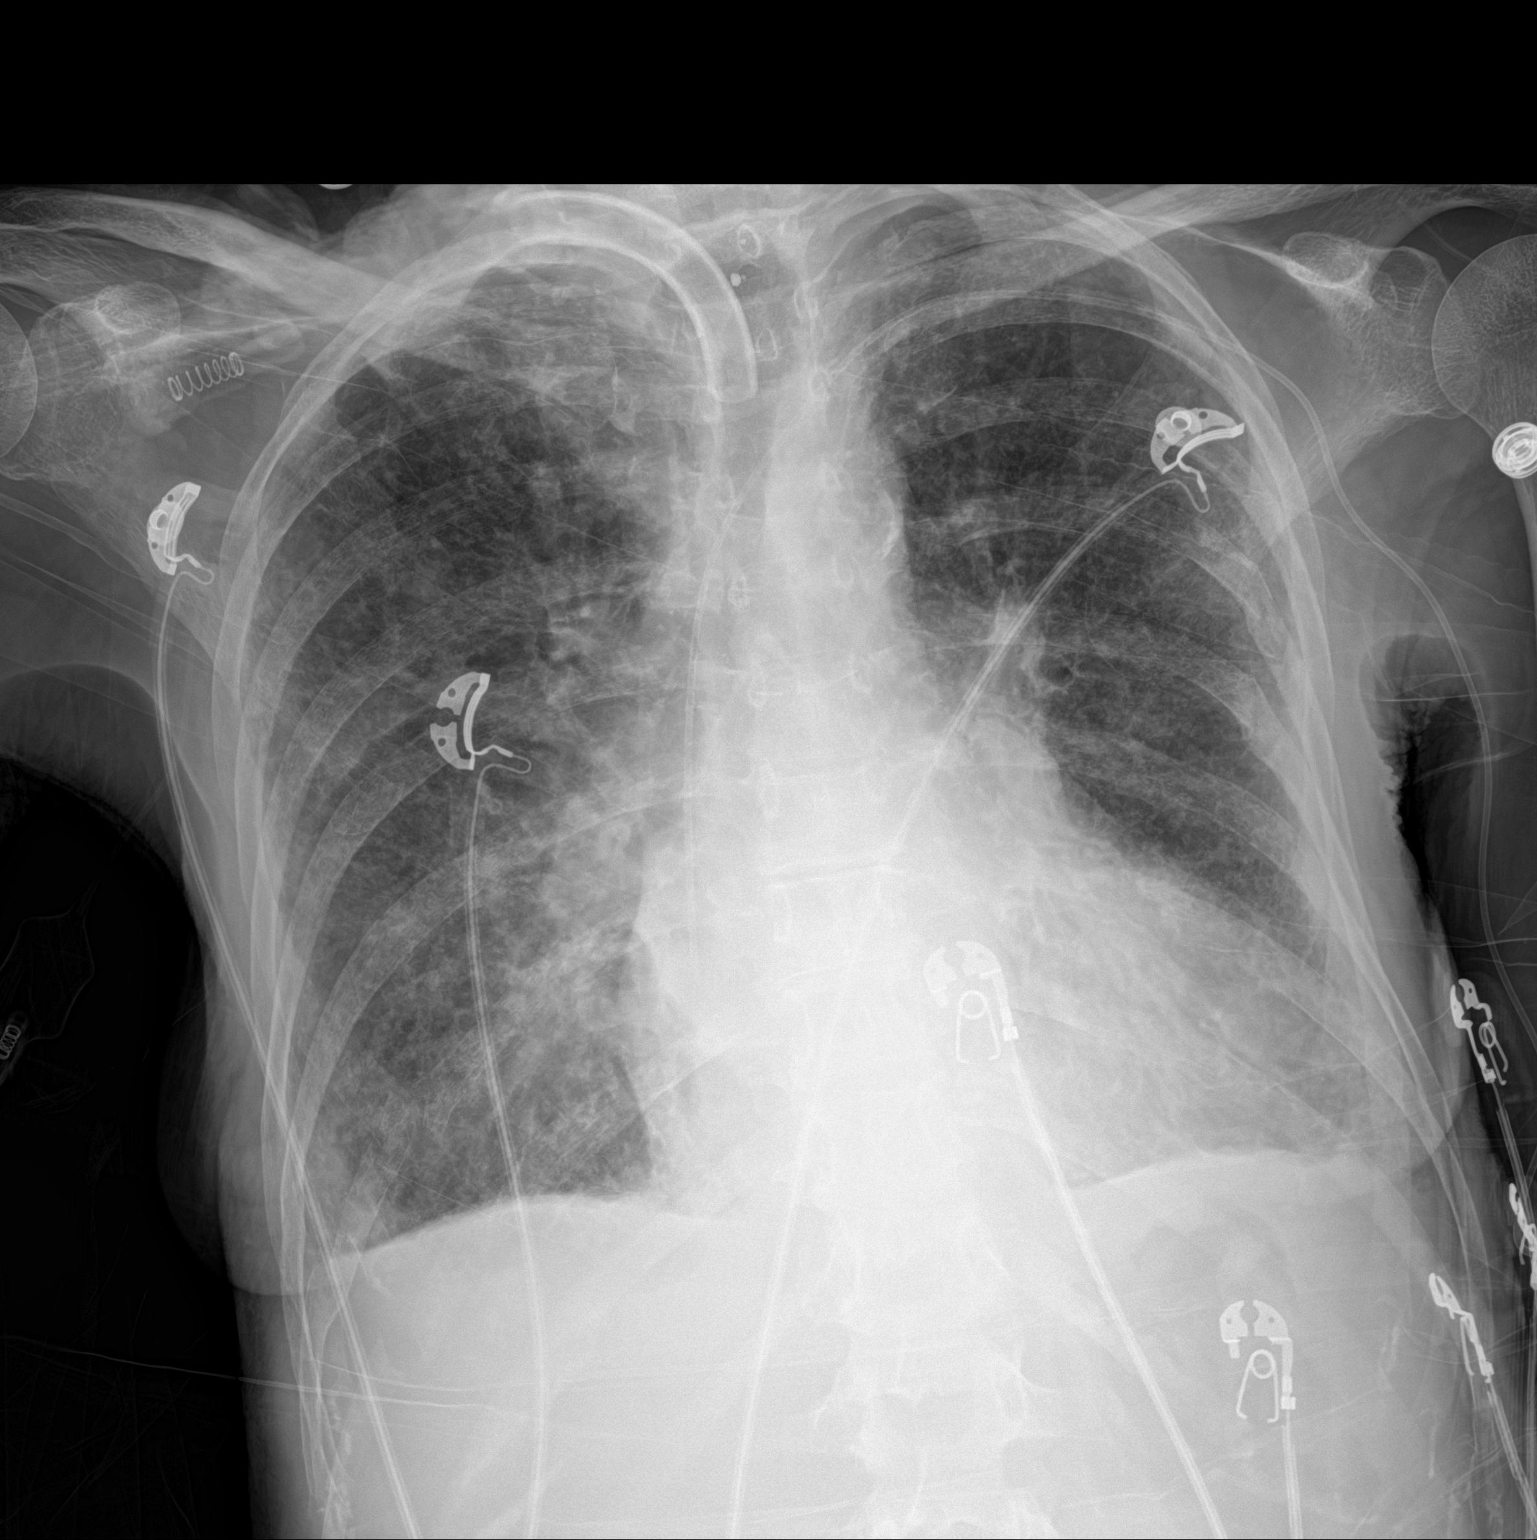

[1 of 1 positions shown; findings below may reference images not displayed]

FINDINGS: Stable cardiomegaly. Tracheostomy tube is in grossly good position.
Left-sided PICC line is noted with distal tip in expected position
of cavoatrial junction. No pneumothorax or pleural effusion is
noted. Stable diffuse interstitial densities are noted throughout
both lungs most consistent with pulmonary fibrosis. Acute
superimposed edema or inflammation cannot be excluded.
Atherosclerosis of thoracic aorta is noted. Old left rib fractures
are noted.
IMPRESSION: Stable diffuse interstitial densities are noted throughout both
lungs most consistent pulmonary fibrosis. Acute superimposed edema
or inflammation cannot be excluded.

Interval placement of left-sided PICC line with distal tip in
expected position of cavoatrial junction.

Aortic Atherosclerosis ([id number]-JX4.4).
